# Patient Record
Sex: Male | Born: 1950 | Race: White | Hispanic: No | Marital: Married | Smoking: Former smoker
Health system: Southern US, Community
[De-identification: ages and names within clinical notes are randomized; demographics above are authoritative.]

## PROBLEM LIST (undated history)

## (undated) DIAGNOSIS — Z973 Presence of spectacles and contact lenses: Secondary | ICD-10-CM

## (undated) DIAGNOSIS — K219 Gastro-esophageal reflux disease without esophagitis: Secondary | ICD-10-CM

## (undated) DIAGNOSIS — Z8709 Personal history of other diseases of the respiratory system: Secondary | ICD-10-CM

## (undated) DIAGNOSIS — I10 Essential (primary) hypertension: Secondary | ICD-10-CM

## (undated) DIAGNOSIS — E785 Hyperlipidemia, unspecified: Secondary | ICD-10-CM

## (undated) HISTORY — PX: TONSILLECTOMY: SUR1361

## (undated) HISTORY — PX: COLONOSCOPY: SHX174

---

## 2005-01-27 LAB — HM COLONOSCOPY: HM Colonoscopy: NORMAL

## 2011-08-16 ENCOUNTER — Observation Stay (HOSPITAL_COMMUNITY)
Admission: EM | Admit: 2011-08-16 | Discharge: 2011-08-17 | Disposition: A | Discharge status: Home / Self Care | Payer: BC Managed Care – PPO | Attending: Emergency Medicine | Admitting: Emergency Medicine

## 2011-08-16 ENCOUNTER — Other Ambulatory Visit: Payer: Self-pay

## 2011-08-16 ENCOUNTER — Emergency Department (HOSPITAL_COMMUNITY): Payer: BC Managed Care – PPO

## 2011-08-16 ENCOUNTER — Encounter (HOSPITAL_COMMUNITY): Payer: Self-pay | Admitting: Emergency Medicine

## 2011-08-16 DIAGNOSIS — K219 Gastro-esophageal reflux disease without esophagitis: Secondary | ICD-10-CM | POA: Insufficient documentation

## 2011-08-16 DIAGNOSIS — F411 Generalized anxiety disorder: Secondary | ICD-10-CM | POA: Insufficient documentation

## 2011-08-16 DIAGNOSIS — R079 Chest pain, unspecified: Principal | ICD-10-CM | POA: Insufficient documentation

## 2011-08-16 DIAGNOSIS — I10 Essential (primary) hypertension: Secondary | ICD-10-CM | POA: Insufficient documentation

## 2011-08-16 DIAGNOSIS — E785 Hyperlipidemia, unspecified: Secondary | ICD-10-CM | POA: Insufficient documentation

## 2011-08-16 HISTORY — DX: Essential (primary) hypertension: I10

## 2011-08-16 HISTORY — DX: Gastro-esophageal reflux disease without esophagitis: K21.9

## 2011-08-16 HISTORY — DX: Hyperlipidemia, unspecified: E78.5

## 2011-08-16 LAB — POCT I-STAT, CHEM 8
BUN: 10 mg/dL (ref 6–23)
Calcium, Ion: 1.12 mmol/L (ref 1.12–1.32)
Chloride: 104 meq/L (ref 96–112)
Creatinine, Ser: 0.9 mg/dL (ref 0.50–1.35)
Glucose, Bld: 120 mg/dL — ABNORMAL HIGH (ref 70–99)
HCT: 40 % (ref 39.0–52.0)
Hemoglobin: 13.6 g/dL (ref 13.0–17.0)
Potassium: 3.6 meq/L (ref 3.5–5.1)
Sodium: 142 meq/L (ref 135–145)
TCO2: 27 mmol/L (ref 0–100)

## 2011-08-16 LAB — CBC
HCT: 39.2 % (ref 39.0–52.0)
Hemoglobin: 14 g/dL (ref 13.0–17.0)
MCH: 33.4 pg (ref 26.0–34.0)
MCHC: 35.7 g/dL (ref 30.0–36.0)
MCV: 93.6 fL (ref 78.0–100.0)
Platelets: 189 K/uL (ref 150–400)
RBC: 4.19 MIL/uL — ABNORMAL LOW (ref 4.22–5.81)
RDW: 13.9 % (ref 11.5–15.5)
WBC: 10.5 K/uL (ref 4.0–10.5)

## 2011-08-16 LAB — POCT I-STAT TROPONIN I: Troponin i, poc: 0.01 ng/mL (ref 0.00–0.08)

## 2011-08-16 MED ORDER — SODIUM CHLORIDE 0.9 % IV SOLN: 1000.0000 mL | INTRAVENOUS | Status: DC

## 2011-08-16 MED ORDER — SODIUM CHLORIDE 0.9 % IV SOLN
20.0000 mL | INTRAVENOUS | Status: DC
  Administered 2011-08-16: 20 mL via INTRAVENOUS

## 2011-08-16 MED ORDER — ACETAMINOPHEN 325 MG PO TABS
650.0000 mg | ORAL_TABLET | Freq: Once | ORAL | Status: AC
  Administered 2011-08-16: 650 mg via ORAL
  Filled 2011-08-16: qty 2

## 2011-08-16 MED ORDER — ZOLPIDEM TARTRATE 5 MG PO TABS
5.0000 mg | ORAL_TABLET | Freq: Every evening | ORAL | Status: DC | PRN
  Administered 2011-08-17: 5 mg via ORAL
  Filled 2011-08-16: qty 1

## 2011-08-16 NOTE — ED Provider Notes (Signed)
History     CSN: [id number]  Arrival date & time 08/16/11  1814   First MD Initiated Contact with Patient 08/16/11 1842      Chief Complaint  Patient presents with  . Chest Pain    (Consider location/radiation/quality/duration/timing/severity/associated sxs/prior treatment) HPI Comments: Patient has had a very stressful and emotional week,  Reporting his mother died and they had her funeral yesterday.  Patient is a 61 y.o. male presenting with chest pain. The history is provided by the patient and the spouse.  Chest Pain The chest pain began 12 - 24 hours ago. Chest pain occurs constantly. The chest pain is unchanged. The pain is associated with stress. At its most intense, the pain is at 1/10 (He denies pain,  describes pressure sensation.). The pain is currently at 1/10. The quality of the pain is described as pressure-like. The pain does not radiate. Exacerbated by: Nothing worsens pain. Pertinent negatives for primary symptoms include no fever, no fatigue, no shortness of breath, no cough, no wheezing, no palpitations, no abdominal pain, no nausea and no dizziness.  Pertinent negatives for associated symptoms include no lower extremity edema, no numbness, no orthopnea and no weakness. Treatments tried: He was given aspirin prior to arrival per EMS.  He was also given 3 doses of nitroglycerin spray, the first dose relieved his pressure somewhat, remaining doses simply caused headache. Risk factors include being elderly and male gender.  His past medical history is significant for anxiety/panic attacks, hyperlipidemia and hypertension. Procedure history comments: He reports having a cardiac stress test 5 years ago at an outside hospital which he states was negative..     Past Medical History  Diagnosis Date  . Hypertension   . Hyperlipidemia   . GERD (gastroesophageal reflux disease)     History reviewed. No pertinent past surgical history.  History reviewed. No pertinent family  history.  History  Substance Use Topics  . Smoking status: Former Games developer  . Smokeless tobacco: Not on file  . Alcohol Use: Yes     socially      Review of Systems  Constitutional: Negative for fever and fatigue.  HENT: Negative for congestion, sore throat and neck pain.   Eyes: Negative.   Respiratory: Positive for chest tightness. Negative for cough, shortness of breath and wheezing.   Cardiovascular: Negative for chest pain, palpitations and orthopnea.  Gastrointestinal: Negative for nausea and abdominal pain.  Genitourinary: Negative.   Musculoskeletal: Negative for joint swelling and arthralgias.  Skin: Negative.  Negative for rash and wound.  Neurological: Negative for dizziness, weakness, light-headedness, numbness and headaches.  Hematological: Negative.   Psychiatric/Behavioral: Negative.     Allergies  Erythromycin  Home Medications   Current Outpatient Rx  Name Route Sig Dispense Refill  . ALBUTEROL SULFATE HFA 108 (90 BASE) MCG/ACT IN AERS Inhalation Inhale 2 puffs into the lungs every 6 (six) hours as needed. For allergies.    . ASPIRIN EC 81 MG PO TBEC Oral Take 81 mg by mouth at bedtime.    . ATORVASTATIN CALCIUM 10 MG PO TABS Oral Take 10 mg by mouth at bedtime.    Marland Kitchen ESOMEPRAZOLE MAGNESIUM 40 MG PO CPDR Oral Take 40 mg by mouth daily before breakfast.    . LISINOPRIL-HYDROCHLOROTHIAZIDE 20-12.5 MG PO TABS Oral Take 1 tablet by mouth daily.    Marland Kitchen LORATADINE 10 MG PO TABS Oral Take 10 mg by mouth daily.    . MOMETASONE FUROATE 50 MCG/ACT NA SUSP Nasal Place 2 sprays  into the nose as needed. Use before flying.    Marland Kitchen SIMETHICONE 80 MG PO CHEW Oral Chew 80 mg by mouth every 6 (six) hours as needed. For gas.    Marland Kitchen ZOLPIDEM TARTRATE 10 MG PO TABS Oral Take 10 mg by mouth at bedtime as needed. For sleep.      BP 139/85  Pulse 66  Temp(Src) 98.5 F (36.9 C) (Oral)  Resp 19  Ht 5\' 11"  (1.803 m)  Wt 203 lb (92.08 kg)  BMI 28.31 kg/m2  SpO2 100%  Physical Exam    Nursing note and vitals reviewed. Constitutional: He is oriented to person, place, and time. He appears well-developed and well-nourished.  HENT:  Head: Normocephalic and atraumatic.  Eyes: Conjunctivae are normal.  Neck: Normal range of motion.  Cardiovascular: Normal rate, regular rhythm, normal heart sounds and intact distal pulses.  Exam reveals no friction rub.   No murmur heard. Pulmonary/Chest: Effort normal and breath sounds normal. He has no wheezes.  Abdominal: Soft. Bowel sounds are normal. There is no tenderness.  Musculoskeletal: Normal range of motion.  Neurological: He is alert and oriented to person, place, and time.  Skin: Skin is warm and dry.  Psychiatric: He has a normal mood and affect.    ED Course  Procedures (including critical care time)  Labs Reviewed  CBC - Abnormal; Notable for the following:    RBC 4.19 (*)    All other components within normal limits  POCT I-STAT, CHEM 8 - Abnormal; Notable for the following:    Glucose, Bld 120 (*)    All other components within normal limits  POCT I-STAT TROPONIN I   Dg Chest Portable 1 View  08/16/2011  *RADIOLOGY REPORT*  Clinical Data: Chest pain.  History of hypertension, pneumonia.  PORTABLE CHEST - 1 VIEW  Comparison: None.  Findings: Heart size is normal.  The lungs are free of focal consolidations and pleural effusions.  No edema.  Degenerative changes are seen in the spine.  IMPRESSION: No evidence for acute cardiopulmonary abnormality.  Original Report Authenticated By: Patterson Hammersmith, M.D.     No diagnosis found.    MDM  Patient placed in cdu for chest pain  cdu observation protocol.  Discussed case with Levy Sjogren,  NP.  Patient was seen by Dr Ignacia Palma prior to being transferred to cdu.       Candis Musa, PA 08/16/11 2109

## 2011-08-16 NOTE — ED Notes (Signed)
Per ems-  Pt coming from [place] where he was seeing his primarcy care doctor for chest pain that started this morning around 0645.  Pt was diagnosed with LBBB.  Pt received 324 asa and three Nitro sl sprays.  Pt states tightness in chest 1/10 at this time.

## 2011-08-16 NOTE — ED Provider Notes (Signed)
9:10 Report received from [facility]. Patient will be coming to CDU tonight on chest pain protocol.  Patient has had a pressure midsternal 1/10 all day. He is for CT of the heart with a BMI around 25. He has a left bundle branch block but this this is not new. Coming from his PCP today. 11:30pm  Report given to Dr. Hyman Hopes.  Pt remains 1/10 presently with chest pressure.     Jethro Bastos, NP 08/17/11 [phone]

## 2011-08-16 NOTE — ED Provider Notes (Signed)
8:27 PM  Date: 08/16/2011  Rate: 64  Rhythm: normal sinus rhythm  QRS Axis: left  Intervals: normal  ST/T Wave abnormalities: ST depressions anteriorly and ST depressions laterally  Conduction Disutrbances:left bundle branch block  Narrative Interpretation: Abnormal EKG.   Old EKG Reviewed: none available    Carleene Cooper III, MD 08/18/11 1610  Carleene Cooper III, MD 08/18/11 [phone]

## 2011-08-17 ENCOUNTER — Other Ambulatory Visit: Payer: Self-pay

## 2011-08-17 ENCOUNTER — Observation Stay (HOSPITAL_COMMUNITY): Payer: BC Managed Care – PPO

## 2011-08-17 LAB — POCT I-STAT TROPONIN I

## 2011-08-17 MED ORDER — METOPROLOL TARTRATE 1 MG/ML IV SOLN
5.0000 mg | Freq: Once | INTRAVENOUS | Status: AC
  Administered 2011-08-17: 5 mg via INTRAVENOUS

## 2011-08-17 MED ORDER — METOPROLOL TARTRATE 25 MG PO TABS
ORAL_TABLET | ORAL | Status: AC
  Filled 2011-08-17: qty 2

## 2011-08-17 MED ORDER — IOHEXOL 350 MG/ML SOLN
80.0000 mL | Freq: Once | INTRAVENOUS | Status: AC | PRN
  Administered 2011-08-17: 80 mL via INTRAVENOUS

## 2011-08-17 MED ORDER — NITROGLYCERIN 0.4 MG SL SUBL
SUBLINGUAL_TABLET | SUBLINGUAL | Status: AC
  Filled 2011-08-17: qty 25

## 2011-08-17 MED ORDER — NITROGLYCERIN 0.4 MG SL SUBL
0.4000 mg | SUBLINGUAL_TABLET | Freq: Once | SUBLINGUAL | Status: AC
  Administered 2011-08-17: 0.4 mg via SUBLINGUAL

## 2011-08-17 MED ORDER — METOPROLOL TARTRATE 1 MG/ML IV SOLN
INTRAVENOUS | Status: AC
  Filled 2011-08-17: qty 10

## 2011-08-17 MED ORDER — METOPROLOL TARTRATE 25 MG PO TABS
50.0000 mg | ORAL_TABLET | Freq: Once | ORAL | Status: AC
  Administered 2011-08-17: 50 mg via ORAL

## 2011-08-17 MED ORDER — PANTOPRAZOLE SODIUM 40 MG PO TBEC
40.0000 mg | DELAYED_RELEASE_TABLET | Freq: Every day | ORAL | Status: DC
  Administered 2011-08-17: 40 mg via ORAL
  Filled 2011-08-17: qty 1

## 2011-08-17 NOTE — ED Notes (Signed)
PT BMI IS 28.3 

## 2011-08-17 NOTE — ED Notes (Signed)
Pt requesting something to help him sleep, pt given Ambien 5mg  for sleep.  Pt denies any chest pain, skin warm and dry color appropriate.  Cardiac Monitor NSR.

## 2011-08-17 NOTE — ED Provider Notes (Signed)
CTA with minimal plaque considered normal for age per Dr. Azell Der. Patient asymptomatic and will follow up with primary care.  Rodena Medin, PA-C 08/17/11 1052

## 2011-08-17 NOTE — ED Provider Notes (Signed)
BP 140/94  Pulse 73  Temp(Src) 97.9 F (36.6 C) (Oral)  Resp 18  Ht 5\' 11"  (1.803 m)  Wt 203 lb (92.08 kg)  BMI 28.31 kg/m2  SpO2 100%   Date: 08/17/2011  Rate: 65  Rhythm: normal sinus rhythm  QRS Axis: left  Intervals: normal  ST/T Wave abnormalities: nonspecific ST changes  Conduction Disutrbances:left bundle branch block  Narrative Interpretation:   Old EKG Reviewed: unchanged    Forbes Cellar, MD 08/17/11 [phone]

## 2011-08-17 NOTE — Discharge Instructions (Signed)
FOLLOW UP WITH A PRIMARY CARE DOCTOR OF YOUR CHOICE. RETURN HERE WITH ANY NEW OR CONCERNING SYMPTOMS.  Chest Pain (Nonspecific) It is often hard to give a specific diagnosis for the cause of chest pain. There is always a chance that your pain could be related to something serious, such as a heart attack or a blood clot in the lungs. You need to follow up with your caregiver for further evaluation. CAUSES   Heartburn.   Pneumonia or bronchitis.   Anxiety or stress.   Inflammation around your heart (pericarditis) or lung (pleuritis or pleurisy).   A blood clot in the lung.   A collapsed lung (pneumothorax). It can develop suddenly on its own (spontaneous pneumothorax) or from injury (trauma) to the chest.   Shingles infection (herpes zoster virus).  The chest wall is composed of bones, muscles, and cartilage. Any of these can be the source of the pain.  The bones can be bruised by injury.   The muscles or cartilage can be strained by coughing or overwork.   The cartilage can be affected by inflammation and become sore (costochondritis).  DIAGNOSIS  Lab tests or other studies, such as X-rays, electrocardiography, stress testing, or cardiac imaging, may be needed to find the cause of your pain.  TREATMENT   Treatment depends on what may be causing your chest pain. Treatment may include:   Acid blockers for heartburn.   Anti-inflammatory medicine.   Pain medicine for inflammatory conditions.   Antibiotics if an infection is present.   You may be advised to change lifestyle habits. This includes stopping smoking and avoiding alcohol, caffeine, and chocolate.   You may be advised to keep your head raised (elevated) when sleeping. This reduces the chance of acid going backward from your stomach into your esophagus.   Most of the time, nonspecific chest pain will improve within 2 to 3 days with rest and mild pain medicine.  HOME CARE INSTRUCTIONS   If antibiotics were  prescribed, take your antibiotics as directed. Finish them even if you start to feel better.   For the next few days, avoid physical activities that bring on chest pain. Continue physical activities as directed.   Do not smoke.   Avoid drinking alcohol.   Only take over-the-counter or prescription medicine for pain, discomfort, or fever as directed by your caregiver.   Follow your caregiver's suggestions for further testing if your chest pain does not go away.   Keep any follow-up appointments you made. If you do not go to an appointment, you could develop lasting (chronic) problems with pain. If there is any problem keeping an appointment, you must call to reschedule.  SEEK MEDICAL CARE IF:   You think you are having problems from the medicine you are taking. Read your medicine instructions carefully.   Your chest pain does not go away, even after treatment.   You develop a rash with blisters on your chest.  SEEK IMMEDIATE MEDICAL CARE IF:   You have increased chest pain or pain that spreads to your arm, neck, jaw, back, or abdomen.   You develop shortness of breath, an increasing cough, or you are coughing up blood.   You have severe back or abdominal pain, feel nauseous, or vomit.   You develop severe weakness, fainting, or chills.   You have a fever.  THIS IS AN EMERGENCY. Do not wait to see if the pain will go away. Get medical help at once. Call your local emergency services (  911 in [place] Do not drive yourself to the hospital. MAKE SURE YOU:   Understand these instructions.   Will watch your condition.   Will get help right away if you are not doing well or get worse.  Document Released: 02/17/2005 Document Revised: 04/29/2011 Document Reviewed: 12/14/2007 [facility] Patient Information 2012 [place], [place].

## 2011-08-17 NOTE — ED Notes (Signed)
Pa has been in to discuss results with pt 

## 2011-08-17 NOTE — Progress Notes (Signed)
Observation review is complete. 

## 2011-08-17 NOTE — ED Notes (Signed)
Pt back in room from ct. Tolerated well

## 2011-08-18 NOTE — ED Provider Notes (Signed)
Medical screening examination/treatment/procedure(s) were conducted as a shared visit with non-physician practitioner(s) and myself.  I personally evaluated the patient during the encounter 61 year old man with chest pressure, referred in from a doctor in [place] because of chest pain and EKG showing left bundle branch block.  He was asymptomatic when seen by me, with EKG showing a left bundle branch block, Cardiac markers negative, and exam of heart and lungs benign.  Advised CDU admission under chest pain protocol.   Carleene Cooper III, MD 08/18/11 [phone]

## 2011-08-18 NOTE — ED Provider Notes (Signed)
Medical screening examination/treatment/procedure(s) were performed by non-physician practitioner and as supervising physician I was immediately available for consultation/collaboration.   Carleene Cooper III, MD 08/18/11 [phone]

## 2011-09-24 ENCOUNTER — Ambulatory Visit: Payer: BC Managed Care – PPO | Admitting: Internal Medicine

## 2011-10-27 ENCOUNTER — Ambulatory Visit: Payer: BC Managed Care – PPO | Admitting: Internal Medicine

## 2011-11-05 ENCOUNTER — Ambulatory Visit (INDEPENDENT_AMBULATORY_CARE_PROVIDER_SITE_OTHER): Payer: BC Managed Care – PPO | Admitting: Internal Medicine

## 2011-11-05 ENCOUNTER — Ambulatory Visit (INDEPENDENT_AMBULATORY_CARE_PROVIDER_SITE_OTHER)
Admission: RE | Admit: 2011-11-05 | Discharge: 2011-11-05 | Disposition: A | Discharge status: Home / Self Care | Payer: BC Managed Care – PPO | Source: Ambulatory Visit | Attending: Internal Medicine | Admitting: Internal Medicine

## 2011-11-05 ENCOUNTER — Encounter: Payer: Self-pay | Admitting: Internal Medicine

## 2011-11-05 VITALS — BP 128/82 | HR 83 | Temp 98.6°F | Resp 16 | Ht 71.0 in | Wt 208.0 lb

## 2011-11-05 DIAGNOSIS — M25571 Pain in right ankle and joints of right foot: Secondary | ICD-10-CM

## 2011-11-05 DIAGNOSIS — Z23 Encounter for immunization: Secondary | ICD-10-CM

## 2011-11-05 DIAGNOSIS — M25579 Pain in unspecified ankle and joints of unspecified foot: Secondary | ICD-10-CM

## 2011-11-05 MED ORDER — CELECOXIB 200 MG PO CAPS: 200.0000 mg | ORAL_CAPSULE | Freq: Every day | ORAL | Status: DC

## 2011-11-05 NOTE — Patient Instructions (Signed)
Degenerative Arthritis  You have osteoarthritis. This is the wear and tear arthritis that comes with aging. It is also called degenerative arthritis. This is common in people past middle age. It is caused by stress on the joints. The large weight bearing joints of the lower extremities are most often affected. The knees, hips, back, neck, and hands can become painful, swollen, and stiff. This is the most common type of arthritis. It comes on with age, carrying too much weight, or from an injury.  Treatment includes resting the sore joint until the pain and swelling improve. Crutches or a walker may be needed for severe flares. Only take over-the-counter or prescription medicines for pain, discomfort, or fever as directed by your caregiver. Local heat therapy may improve motion. Cortisone shots into the joint are sometimes used to reduce pain and swelling during flares.  Osteoarthritis is usually not crippling and progresses slowly. There are things you can do to decrease pain:  · Avoid high impact activities.  · Exercise regularly.  · Low impact exercises such as walking, biking and swimming help to keep the muscles strong and keep normal joint function.  · Stretching helps to keep your range of motion.  · Lose weight if you are overweight. This reduces joint stress.  In severe cases when you have pain at rest or increasing disability, joint surgery may be helpful. See your caregiver for follow-up treatment as recommended.   SEEK IMMEDIATE MEDICAL CARE IF:   · You have severe joint pain.  · Marked swelling and redness in your joint develops.  · You develop a high fever.  Document Released: 05/10/2005 Document Revised: 04/29/2011 Document Reviewed: 10/10/2006  [employer]® Patient Information ©2012 [employer].

## 2011-11-05 NOTE — Progress Notes (Signed)
  Subjective:    Patient ID: Kevin Aguirre, male    DOB: [birth year], 61 y.o.   MRN: [id number]  Arthritis Presents for initial visit. The disease course has been stable. Onset time: 5. He complains of pain and stiffness. He reports no joint swelling or joint warmth. Affected locations include the right ankle. His pain is at a severity of 2/10. Pertinent negatives include no pain at night, pain while resting, rash or weight loss. His past medical history is significant for osteoarthritis (right knee). There is no history of chronic back pain, lupus, psoriasis or rheumatoid arthritis.  His pertinent risk factors include overuse. His family medical history includes family history of osteoarthritis. Past treatments include nothing. Factors aggravating his arthritis include activity.      Review of Systems  Constitutional: Negative.  Negative for weight loss.  HENT: Negative.   Eyes: Negative.   Respiratory: Negative.   Cardiovascular: Negative.   Gastrointestinal: Negative.   Genitourinary: Negative.   Musculoskeletal: Positive for arthralgias (right ankle), arthritis and stiffness. Negative for myalgias, back pain, joint swelling and gait problem.  Skin: Negative.  Negative for rash.  Neurological: Negative.   Hematological: Negative.   Psychiatric/Behavioral: Negative.        Objective:   Physical Exam  Vitals reviewed. Constitutional: He is oriented to person, place, and time. He appears well-developed and well-nourished. No distress.  HENT:  Head: Normocephalic and atraumatic.  Mouth/Throat: No oropharyngeal exudate.  Eyes: Conjunctivae are normal. Right eye exhibits no discharge. Left eye exhibits no discharge. No scleral icterus.  Neck: Normal range of motion. Neck supple. No JVD present. No tracheal deviation present. No thyromegaly present.  Cardiovascular: Normal rate, regular rhythm, normal heart sounds and intact distal pulses.  Exam reveals no gallop and no friction rub.    No murmur heard. Pulmonary/Chest: Effort normal and breath sounds normal. No respiratory distress. He has no wheezes. He has no rales. He exhibits no tenderness.  Abdominal: Soft. Bowel sounds are normal. He exhibits no distension and no mass. There is no tenderness. There is no rebound and no guarding.  Musculoskeletal: Normal range of motion. He exhibits no edema and no tenderness.       Right knee: Normal. He exhibits normal range of motion, no swelling, no effusion, no ecchymosis, no deformity, no laceration, no erythema, normal alignment, no LCL laxity, normal patellar mobility, no bony tenderness, normal meniscus and no MCL laxity. no tenderness found.       Right ankle: Normal. He exhibits normal range of motion, no swelling, no ecchymosis, no deformity, no laceration and normal pulse. no tenderness. No lateral malleolus and no medial malleolus tenderness found. Achilles tendon normal. Achilles tendon exhibits no pain, no defect and normal Thompson's test results.  Lymphadenopathy:    He has no cervical adenopathy.  Neurological: He is oriented to person, place, and time.  Skin: Skin is warm and dry. No rash noted. He is not diaphoretic. No erythema. No pallor.  Psychiatric: He has a normal mood and affect. His behavior is normal. Judgment normal.          Assessment & Plan:

## 2011-11-07 ENCOUNTER — Encounter: Payer: Self-pay | Admitting: Internal Medicine

## 2011-11-07 NOTE — Assessment & Plan Note (Signed)
Exam and xray are normal, he will try celebrex and will consider seeing ortho, he has a torn meniscus in his right knee that may need repair as that may be affecting his right ankle

## 2011-11-30 ENCOUNTER — Encounter: Payer: Self-pay | Admitting: Internal Medicine

## 2011-11-30 ENCOUNTER — Ambulatory Visit (INDEPENDENT_AMBULATORY_CARE_PROVIDER_SITE_OTHER): Payer: BC Managed Care – PPO | Admitting: Internal Medicine

## 2011-11-30 ENCOUNTER — Ambulatory Visit (INDEPENDENT_AMBULATORY_CARE_PROVIDER_SITE_OTHER)
Admission: RE | Admit: 2011-11-30 | Discharge: 2011-11-30 | Disposition: A | Discharge status: Home / Self Care | Payer: BC Managed Care – PPO | Source: Ambulatory Visit | Attending: Internal Medicine | Admitting: Internal Medicine

## 2011-11-30 VITALS — BP 134/82 | HR 72 | Temp 97.5°F | Resp 16 | Wt 209.2 lb

## 2011-11-30 DIAGNOSIS — M545 Low back pain: Secondary | ICD-10-CM

## 2011-11-30 DIAGNOSIS — M25571 Pain in right ankle and joints of right foot: Secondary | ICD-10-CM

## 2011-11-30 DIAGNOSIS — M25579 Pain in unspecified ankle and joints of unspecified foot: Secondary | ICD-10-CM

## 2011-11-30 MED ORDER — CELECOXIB 200 MG PO CAPS: 200.0000 mg | ORAL_CAPSULE | Freq: Every day | ORAL | Status: DC

## 2011-11-30 MED ORDER — OXYCODONE-ACETAMINOPHEN 7.5-325 MG PO TABS: 1.0000 | ORAL_TABLET | ORAL | Status: AC | PRN

## 2011-11-30 MED ORDER — CELECOXIB 200 MG PO CAPS: 200.0000 mg | ORAL_CAPSULE | Freq: Every day | ORAL | Status: AC

## 2011-11-30 NOTE — Assessment & Plan Note (Signed)
He tells me that the pain resolved with celebrex

## 2011-11-30 NOTE — Progress Notes (Signed)
Subjective:    Patient ID: Kevin Aguirre, male    DOB: 01/20/1951, 61 y.o.   MRN: [id number]  Back Pain This is a recurrent problem. The current episode started 1 to 4 weeks ago. The problem occurs intermittently. The problem is unchanged. The pain is present in the lumbar spine. The quality of the pain is described as aching ("tightness"). The pain does not radiate. The pain is at a severity of 5/10. The pain is moderate. The pain is worse during the day. The symptoms are aggravated by twisting. Stiffness is present all day. Pertinent negatives include no abdominal pain, bladder incontinence, bowel incontinence, chest pain, dysuria, fever, headaches, leg pain, numbness, paresis, paresthesias, pelvic pain, perianal numbness, tingling, weakness or weight loss. Risk factors include recent trauma (heavy lifting). He has tried NSAIDs, ice and analgesics for the symptoms. The treatment provided mild relief.      Review of Systems  Constitutional: Negative.  Negative for fever and weight loss.  HENT: Negative.   Eyes: Negative.   Respiratory: Negative.   Cardiovascular: Negative.  Negative for chest pain.  Gastrointestinal: Negative.  Negative for abdominal pain and bowel incontinence.  Genitourinary: Negative.  Negative for bladder incontinence, dysuria and pelvic pain.  Musculoskeletal: Positive for back pain. Negative for myalgias, joint swelling, arthralgias and gait problem.  Skin: Negative.   Neurological: Negative.  Negative for tingling, tremors, weakness, numbness, headaches and paresthesias.  Hematological: Negative.   Psychiatric/Behavioral: Negative.        Objective:   Physical Exam  Vitals reviewed. Constitutional: He is oriented to person, place, and time. He appears well-developed and well-nourished. No distress.  HENT:  Head: Normocephalic and atraumatic.  Mouth/Throat: Oropharynx is clear and moist. No oropharyngeal exudate.  Eyes: Conjunctivae are normal. Right eye  exhibits no discharge. Left eye exhibits no discharge. No scleral icterus.  Neck: Normal range of motion. Neck supple. No JVD present. No tracheal deviation present. No thyromegaly present.  Cardiovascular: Normal rate, regular rhythm, normal heart sounds and intact distal pulses.  Exam reveals no gallop and no friction rub.   No murmur heard. Pulmonary/Chest: Effort normal and breath sounds normal. No stridor. No respiratory distress. He has no wheezes. He has no rales. He exhibits no tenderness.  Abdominal: Soft. Bowel sounds are normal. He exhibits no distension and no mass. There is no tenderness. There is no rebound and no guarding.  Musculoskeletal: Normal range of motion. He exhibits no edema and no tenderness.       Lumbar back: Normal. He exhibits normal range of motion, no tenderness, no bony tenderness, no swelling, no edema, no deformity, no laceration, no pain, no spasm and normal pulse.  Lymphadenopathy:    He has no cervical adenopathy.  Neurological: He is alert and oriented to person, place, and time. He has normal strength. He displays no atrophy, no tremor and normal reflexes. No cranial nerve deficit or sensory deficit. He exhibits normal muscle tone. He displays a negative Romberg sign. He displays no seizure activity. Coordination and gait normal. He displays no Babinski's sign on the right side. He displays no Babinski's sign on the left side.  Reflex Scores:      Tricep reflexes are 1+ on the right side and 1+ on the left side.      Bicep reflexes are 1+ on the left side.      Brachioradialis reflexes are 1+ on the right side.      Patellar reflexes are 1+ on the right side and  1+ on the left side.      Achilles reflexes are 1+ on the right side and 1+ on the left side.      - SLR in BLE  Skin: Skin is warm and dry. No rash noted. He is not diaphoretic. No erythema. No pallor.  Psychiatric: He has a normal mood and affect. His behavior is normal. Judgment and thought  content normal.          Assessment & Plan:

## 2011-11-30 NOTE — Patient Instructions (Signed)

## 2011-11-30 NOTE — Assessment & Plan Note (Signed)
He has no s/s of radiculopathy. I will check plain films to look for ddd, spurs, occult fracture, he will continue taking the current meds and I have given him percocet tot take for more severe pain

## 2011-12-02 ENCOUNTER — Other Ambulatory Visit: Payer: Self-pay | Admitting: Internal Medicine

## 2011-12-02 DIAGNOSIS — R937 Abnormal findings on diagnostic imaging of other parts of musculoskeletal system: Secondary | ICD-10-CM

## 2011-12-02 DIAGNOSIS — M545 Low back pain: Secondary | ICD-10-CM

## 2011-12-09 ENCOUNTER — Ambulatory Visit
Admission: RE | Admit: 2011-12-09 | Discharge: 2011-12-09 | Disposition: A | Discharge status: Home / Self Care | Payer: BC Managed Care – PPO | Source: Ambulatory Visit | Attending: Internal Medicine | Admitting: Internal Medicine

## 2011-12-09 DIAGNOSIS — R937 Abnormal findings on diagnostic imaging of other parts of musculoskeletal system: Secondary | ICD-10-CM

## 2011-12-09 DIAGNOSIS — M545 Low back pain: Secondary | ICD-10-CM

## 2011-12-10 ENCOUNTER — Other Ambulatory Visit: Payer: BC Managed Care – PPO

## 2011-12-10 ENCOUNTER — Encounter: Payer: Self-pay | Admitting: Internal Medicine

## 2012-04-05 ENCOUNTER — Telehealth: Payer: Self-pay | Admitting: Internal Medicine

## 2012-04-05 MED ORDER — LISINOPRIL-HYDROCHLOROTHIAZIDE 20-12.5 MG PO TABS: 1.0000 | ORAL_TABLET | Freq: Every day | ORAL | Status: DC

## 2012-04-05 NOTE — Telephone Encounter (Signed)
Patient needs a refill on Lisinopril sent to [facility], and also a 30 day supply sent to [place] [facility] to last until he gets the medication from [facility]

## 2012-04-10 ENCOUNTER — Other Ambulatory Visit: Payer: Self-pay | Admitting: Internal Medicine

## 2012-04-10 MED ORDER — MOMETASONE FUROATE 50 MCG/ACT NA SUSP: 2.0000 | NASAL | Status: DC | PRN

## 2012-05-15 ENCOUNTER — Other Ambulatory Visit (INDEPENDENT_AMBULATORY_CARE_PROVIDER_SITE_OTHER): Payer: BC Managed Care – PPO

## 2012-05-15 ENCOUNTER — Ambulatory Visit (INDEPENDENT_AMBULATORY_CARE_PROVIDER_SITE_OTHER): Payer: BC Managed Care – PPO | Admitting: Internal Medicine

## 2012-05-15 ENCOUNTER — Encounter: Payer: Self-pay | Admitting: Internal Medicine

## 2012-05-15 VITALS — BP 112/68 | HR 73 | Temp 97.8°F | Resp 16 | Wt 207.5 lb

## 2012-05-15 DIAGNOSIS — R7309 Other abnormal glucose: Secondary | ICD-10-CM

## 2012-05-15 DIAGNOSIS — Z0000 Encounter for general adult medical examination without abnormal findings: Secondary | ICD-10-CM

## 2012-05-15 DIAGNOSIS — I10 Essential (primary) hypertension: Secondary | ICD-10-CM

## 2012-05-15 DIAGNOSIS — M545 Low back pain: Secondary | ICD-10-CM

## 2012-05-15 DIAGNOSIS — I2510 Atherosclerotic heart disease of native coronary artery without angina pectoris: Secondary | ICD-10-CM | POA: Insufficient documentation

## 2012-05-15 DIAGNOSIS — R9431 Abnormal electrocardiogram [ECG] [EKG]: Secondary | ICD-10-CM

## 2012-05-15 LAB — CBC WITH DIFFERENTIAL/PLATELET
Basophils Absolute: 0 10*3/uL (ref 0.0–0.1)
Hemoglobin: 15.6 g/dL (ref 13.0–17.0)
Lymphocytes Relative: 29.9 % (ref 12.0–46.0)
Monocytes Relative: 8.5 % (ref 3.0–12.0)
Neutro Abs: 4.5 10*3/uL (ref 1.4–7.7)
Neutrophils Relative %: 58.9 % (ref 43.0–77.0)
RBC: 4.71 Mil/uL (ref 4.22–5.81)
RDW: 13.5 % (ref 11.5–14.6)

## 2012-05-15 LAB — URINALYSIS, ROUTINE W REFLEX MICROSCOPIC
Specific Gravity, Urine: 1.015 (ref 1.000–1.030)
Total Protein, Urine: NEGATIVE
Urine Glucose: NEGATIVE

## 2012-05-15 LAB — PSA: PSA: 1.1 ng/mL (ref 0.10–4.00)

## 2012-05-15 LAB — LIPID PANEL
Cholesterol: 159 mg/dL (ref 0–200)
Triglycerides: 140 mg/dL (ref 0.0–149.0)

## 2012-05-15 LAB — COMPREHENSIVE METABOLIC PANEL
ALT: 49 U/L (ref 0–53)
BUN: 12 mg/dL (ref 6–23)
CO2: 29 mEq/L (ref 19–32)
Calcium: 9.3 mg/dL (ref 8.4–10.5)
Chloride: 102 mEq/L (ref 96–112)
Creatinine, Ser: 1.2 mg/dL (ref 0.4–1.5)
GFR: 68.21 mL/min (ref >60.00)

## 2012-05-15 NOTE — Assessment & Plan Note (Signed)
Exam done Vaccines were reviewed Labs were ordered Pt ed material was given

## 2012-05-15 NOTE — Assessment & Plan Note (Signed)
He has no s/s of this He will continue with risk modifications

## 2012-05-15 NOTE — Progress Notes (Signed)
Subjective:    Patient ID: Kevin Aguirre, male    DOB: 12-Feb-1951, 61 y.o.   MRN: [id number]  Hypertension This is a chronic problem. The current episode started more than 1 year ago. The problem is unchanged. The problem is controlled. Pertinent negatives include no anxiety, blurred vision, chest pain, headaches, malaise/fatigue, neck pain, orthopnea, palpitations, peripheral edema, PND, shortness of breath or sweats. Past treatments include ACE inhibitors and diuretics. The current treatment provides moderate improvement. Compliance problems include exercise and diet.  Hypertensive end-organ damage includes CAD/MI.      Review of Systems  Constitutional: Negative for fever, chills, malaise/fatigue, diaphoresis, activity change, appetite change, fatigue and unexpected weight change.  HENT: Negative.  Negative for neck pain.   Eyes: Negative.  Negative for blurred vision.  Respiratory: Negative for apnea, cough, choking, chest tightness, shortness of breath, wheezing and stridor.   Cardiovascular: Negative for chest pain, palpitations, orthopnea, leg swelling and PND.  Gastrointestinal: Negative for nausea, vomiting, abdominal pain, diarrhea, constipation and blood in stool.  Genitourinary: Negative.  Negative for urgency, frequency, decreased urine volume, difficulty urinating and testicular pain.  Musculoskeletal: Negative for myalgias, back pain, joint swelling, arthralgias and gait problem.  Skin: Negative for color change, pallor, rash and wound.  Neurological: Negative for dizziness, tremors, seizures, syncope, facial asymmetry, speech difficulty, weakness, light-headedness, numbness and headaches.  Hematological: Negative for adenopathy. Does not bruise/bleed easily.  Psychiatric/Behavioral: Negative.        Objective:   Physical Exam  Vitals reviewed. Constitutional: He is oriented to person, place, and time. He appears well-developed and well-nourished. No distress.  HENT:   Head: Normocephalic and atraumatic.  Mouth/Throat: Oropharynx is clear and moist. No oropharyngeal exudate.  Eyes: Conjunctivae normal are normal. Right eye exhibits no discharge. Left eye exhibits no discharge. No scleral icterus.  Neck: Normal range of motion. Neck supple. No JVD present. No tracheal deviation present. No thyromegaly present.  Cardiovascular: Normal rate, regular rhythm, normal heart sounds and intact distal pulses.  Exam reveals no gallop and no friction rub.   No murmur heard. Pulmonary/Chest: Effort normal and breath sounds normal. No stridor. No respiratory distress. He has no wheezes. He has no rales. He exhibits no tenderness.  Abdominal: Soft. Bowel sounds are normal. He exhibits no distension and no mass. There is no tenderness. There is no rebound and no guarding. Hernia confirmed negative in the right inguinal area and confirmed negative in the left inguinal area.  Genitourinary: Rectum normal, testes normal and penis normal. Rectal exam shows no external hemorrhoid, no internal hemorrhoid, no fissure, no mass, no tenderness and anal tone normal. Guaiac negative stool. Prostate is not enlarged and not tender. Right testis shows no mass, no swelling and no tenderness. Right testis is descended. Left testis shows no mass, no swelling and no tenderness. Left testis is descended. Circumcised. No penile erythema or penile tenderness. No discharge found.  Musculoskeletal: Normal range of motion. He exhibits no edema and no tenderness.  Lymphadenopathy:    He has no cervical adenopathy.       Right: No inguinal adenopathy present.       Left: No inguinal adenopathy present.  Neurological: He is oriented to person, place, and time.  Skin: Skin is warm and dry. No rash noted. He is not diaphoretic. No erythema. No pallor.  Psychiatric: He has a normal mood and affect. His behavior is normal. Judgment and thought content normal.     Lab Results  Component Value Date  WBC  10.5 08/16/2011   HGB 13.6 08/16/2011   HCT 40.0 08/16/2011   PLT 189 08/16/2011   GLUCOSE 120* 08/16/2011   NA 142 08/16/2011   K 3.6 08/16/2011   CL 104 08/16/2011   CREATININE 0.90 08/16/2011   BUN 10 08/16/2011        Assessment & Plan:

## 2012-05-15 NOTE — Patient Instructions (Signed)
Hypertension As your heart beats, it forces blood through your arteries. This force is your blood pressure. If the pressure is too high, it is called hypertension (HTN) or high blood pressure. HTN is dangerous because you may have it and not know it. High blood pressure may mean that your heart has to work harder to pump blood. Your arteries may be narrow or stiff. The extra work puts you at risk for heart disease, stroke, and other problems.  Blood pressure consists of two numbers, a higher number over a lower, 110/72, for example. It is stated as "110 over 72." The ideal is below 120 for the top number (systolic) and under 80 for the bottom (diastolic). Write down your blood pressure today. You should pay close attention to your blood pressure if you have certain conditions such as:  Heart failure.  Prior heart attack.  Diabetes  Chronic kidney disease.  Prior stroke.  Multiple risk factors for heart disease. To see if you have HTN, your blood pressure should be measured while you are seated with your arm held at the level of the heart. It should be measured at least twice. A one-time elevated blood pressure reading (especially in the Emergency Department) does not mean that you need treatment. There may be conditions in which the blood pressure is different between your right and left arms. It is important to see your caregiver soon for a recheck. Most people have essential hypertension which means that there is not a specific cause. This type of high blood pressure may be lowered by changing lifestyle factors such as:  Stress.  Smoking.  Lack of exercise.  Excessive weight.  Drug/tobacco/alcohol use.  Eating less salt. Most people do not have symptoms from high blood pressure until it has caused damage to the body. Effective treatment can often prevent, delay or reduce that damage. TREATMENT  When a cause has been identified, treatment for high blood pressure is directed at the  cause. There are a large number of medications to treat HTN. These fall into several categories, and your caregiver will help you select the medicines that are best for you. Medications may have side effects. You should review side effects with your caregiver. If your blood pressure stays high after you have made lifestyle changes or started on medicines,   Your medication(s) may need to be changed.  Other problems may need to be addressed.  Be certain you understand your prescriptions, and know how and when to take your medicine.  Be sure to follow up with your caregiver within the time frame advised (usually within two weeks) to have your blood pressure rechecked and to review your medications.  If you are taking more than one medicine to lower your blood pressure, make sure you know how and at what times they should be taken. Taking two medicines at the same time can result in blood pressure that is too low. SEEK IMMEDIATE MEDICAL CARE IF:  You develop a severe headache, blurred or changing vision, or confusion.  You have unusual weakness or numbness, or a faint feeling.  You have severe chest or abdominal pain, vomiting, or breathing problems. MAKE SURE YOU:   Understand these instructions.  Will watch your condition.  Will get help right away if you are not doing well or get worse. Document Released: 05/10/2005 Document Revised: 08/02/2011 Document Reviewed: 12/29/2007 Exit[employer] Patient Information 2013 [employer]. Health Maintenance, Males A healthy lifestyle and preventative care can promote health and wellness.  Maintain   regular health, dental, and eye exams.  Eat a healthy diet. Foods like vegetables, fruits, whole grains, low-fat dairy products, and lean protein foods contain the nutrients you need without too many calories. Decrease your intake of foods high in solid fats, added sugars, and salt. Get information about a proper diet from your caregiver, if  necessary.  Regular physical exercise is one of the most important things you can do for your health. Most adults should get at least 150 minutes of moderate-intensity exercise (any activity that increases your heart rate and causes you to sweat) each week. In addition, most adults need muscle-strengthening exercises on 2 or more days a week.   Maintain a healthy weight. The body mass index (BMI) is a screening tool to identify possible weight problems. It provides an estimate of body fat based on height and weight. Your caregiver can help determine your BMI, and can help you achieve or maintain a healthy weight. For adults 20 years and older:  A BMI below 18.5 is considered underweight.  A BMI of 18.5 to 24.9 is normal.  A BMI of 25 to 29.9 is considered overweight.  A BMI of 30 and above is considered obese.  Maintain normal blood lipids and cholesterol by exercising and minimizing your intake of saturated fat. Eat a balanced diet with plenty of fruits and vegetables. Blood tests for lipids and cholesterol should begin at age 20 and be repeated every 5 years. If your lipid or cholesterol levels are high, you are over 50, or you are a high risk for heart disease, you may need your cholesterol levels checked more frequently.Ongoing high lipid and cholesterol levels should be treated with medicines, if diet and exercise are not effective.  If you smoke, find out from your caregiver how to quit. If you do not use tobacco, do not start.  If you choose to drink alcohol, do not exceed 2 drinks per day. One drink is considered to be 12 ounces (355 mL) of beer, 5 ounces (148 mL) of wine, or 1.5 ounces (44 mL) of liquor.  Avoid use of street drugs. Do not share needles with anyone. Ask for help if you need support or instructions about stopping the use of drugs.  High blood pressure causes heart disease and increases the risk of stroke. Blood pressure should be checked at least every 1 to 2 years.  Ongoing high blood pressure should be treated with medicines if weight loss and exercise are not effective.  If you are 45 to 63 years old, ask your caregiver if you should take aspirin to prevent heart disease.  Diabetes screening involves taking a blood sample to check your fasting blood sugar level. This should be done once every 3 years, after age 45, if you are within normal weight and without risk factors for diabetes. Testing should be considered at a younger age or be carried out more frequently if you are overweight and have at least 1 risk factor for diabetes.  Colorectal cancer can be detected and often prevented. Most routine colorectal cancer screening begins at the age of 50 and continues through age 75. However, your caregiver may recommend screening at an earlier age if you have risk factors for colon cancer. On a yearly basis, your caregiver may provide home test kits to check for hidden blood in the stool. Use of a small camera at the end of a tube, to directly examine the colon (sigmoidoscopy or colonoscopy), can detect the earliest forms of colorectal   cancer. Talk to your caregiver about this at age 50, when routine screening begins. Direct examination of the colon should be repeated every 5 to 10 years through age 75, unless early forms of pre-cancerous polyps or small growths are found.  Hepatitis C blood testing is recommended for all people born from 1945 through 1965 and any individual with known risks for hepatitis C.  Healthy men should no longer receive prostate-specific antigen (PSA) blood tests as part of routine cancer screening. Consult with your caregiver about prostate cancer screening.  Testicular cancer screening is not recommended for adolescents or adult males who have no symptoms. Screening includes self-exam, caregiver exam, and other screening tests. Consult with your caregiver about any symptoms you have or any concerns you have about testicular  cancer.  Practice safe sex. Use condoms and avoid high-risk sexual practices to reduce the spread of sexually transmitted infections (STIs).  Use sunscreen with a sun protection factor (SPF) of 30 or greater. Apply sunscreen liberally and repeatedly throughout the day. You should seek shade when your shadow is shorter than you. Protect yourself by wearing long sleeves, pants, a wide-brimmed hat, and sunglasses year round, whenever you are outdoors.  Notify your caregiver of new moles or changes in moles, especially if there is a change in shape or color. Also notify your caregiver if a mole is larger than the size of a pencil eraser.  A one-time screening for abdominal aortic aneurysm (AAA) and surgical repair of large AAAs by sound wave imaging (ultrasonography) is recommended for ages 65 to 75 years who are current or former smokers.  Stay current with your immunizations. Document Released: 11/06/2007 Document Revised: 08/02/2011 Document Reviewed: 10/05/2010 [employer] Patient Information 2013 [employer].  

## 2012-05-15 NOTE — Assessment & Plan Note (Signed)
His BP is well controlled I will check his lytes and renal function 

## 2012-05-15 NOTE — Assessment & Plan Note (Signed)
I will check his A1C today to see if he has developed DM II 

## 2012-05-15 NOTE — Assessment & Plan Note (Signed)
His EKG is unchanged from prior EKG's

## 2012-06-02 ENCOUNTER — Other Ambulatory Visit: Payer: Self-pay

## 2012-06-05 ENCOUNTER — Other Ambulatory Visit: Payer: Self-pay

## 2012-06-05 MED ORDER — ESOMEPRAZOLE MAGNESIUM 40 MG PO CPDR: 40.0000 mg | DELAYED_RELEASE_CAPSULE | Freq: Every day | ORAL | Status: DC

## 2012-06-13 ENCOUNTER — Encounter: Payer: Self-pay | Admitting: Internal Medicine

## 2012-08-31 ENCOUNTER — Encounter: Payer: Self-pay | Admitting: Internal Medicine

## 2012-08-31 ENCOUNTER — Ambulatory Visit (INDEPENDENT_AMBULATORY_CARE_PROVIDER_SITE_OTHER): Payer: BC Managed Care – PPO | Admitting: Internal Medicine

## 2012-08-31 ENCOUNTER — Ambulatory Visit (INDEPENDENT_AMBULATORY_CARE_PROVIDER_SITE_OTHER)
Admission: RE | Admit: 2012-08-31 | Discharge: 2012-08-31 | Disposition: A | Discharge status: Home / Self Care | Payer: BC Managed Care – PPO | Source: Ambulatory Visit | Attending: Internal Medicine | Admitting: Internal Medicine

## 2012-08-31 VITALS — BP 110/78 | HR 92 | Temp 98.0°F | Resp 16 | Wt 205.0 lb

## 2012-08-31 DIAGNOSIS — J309 Allergic rhinitis, unspecified: Secondary | ICD-10-CM | POA: Insufficient documentation

## 2012-08-31 DIAGNOSIS — I2510 Atherosclerotic heart disease of native coronary artery without angina pectoris: Secondary | ICD-10-CM

## 2012-08-31 DIAGNOSIS — I10 Essential (primary) hypertension: Secondary | ICD-10-CM

## 2012-08-31 DIAGNOSIS — R05 Cough: Secondary | ICD-10-CM

## 2012-08-31 DIAGNOSIS — J209 Acute bronchitis, unspecified: Secondary | ICD-10-CM | POA: Insufficient documentation

## 2012-08-31 DIAGNOSIS — K219 Gastro-esophageal reflux disease without esophagitis: Secondary | ICD-10-CM | POA: Insufficient documentation

## 2012-08-31 MED ORDER — MOMETASONE FUROATE 50 MCG/ACT NA SUSP: 2.0000 | NASAL | Status: DC | PRN

## 2012-08-31 MED ORDER — OLMESARTAN MEDOXOMIL 20 MG PO TABS: 20.0000 mg | ORAL_TABLET | Freq: Every day | ORAL | Status: DC

## 2012-08-31 MED ORDER — HYDROCOD POLST-CPM POLST ER 10-8 MG PO CP12: 1.0000 | ORAL_CAPSULE | Freq: Two times a day (BID) | ORAL | Status: DC | PRN

## 2012-08-31 MED ORDER — ZOLPIDEM TARTRATE 10 MG PO TABS: 10.0000 mg | ORAL_TABLET | Freq: Every evening | ORAL | Status: DC | PRN

## 2012-08-31 MED ORDER — ATORVASTATIN CALCIUM 10 MG PO TABS: 10.0000 mg | ORAL_TABLET | Freq: Every day | ORAL | Status: DC

## 2012-08-31 MED ORDER — ESOMEPRAZOLE MAGNESIUM 40 MG PO CPDR: 40.0000 mg | DELAYED_RELEASE_CAPSULE | Freq: Every day | ORAL | Status: DC

## 2012-08-31 NOTE — Progress Notes (Signed)
Subjective:    Patient ID: Kevin Aguirre, male    DOB: 1950/12/09, 62 y.o.   MRN: [id number]  Cough This is a recurrent problem. The current episode started more than 1 month ago. The problem has been gradually worsening. The problem occurs every few hours. The cough is non-productive. Pertinent negatives include no chest pain, chills, ear congestion, ear pain, fever, headaches, heartburn, hemoptysis, myalgias, nasal congestion, postnasal drip, rash, rhinorrhea, sore throat, shortness of breath, sweats, weight loss or wheezing. He has tried OTC cough suppressant and prescription cough suppressant for the symptoms. The treatment provided no relief. There is no history of asthma, bronchiectasis, bronchitis, COPD, emphysema, environmental allergies or pneumonia.      Review of Systems  Constitutional: Negative.  Negative for fever, chills, weight loss, diaphoresis, activity change, appetite change, fatigue and unexpected weight change.  HENT: Negative.  Negative for ear pain, sore throat, rhinorrhea and postnasal drip.   Eyes: Negative.   Respiratory: Positive for cough. Negative for apnea, hemoptysis, choking, chest tightness, shortness of breath, wheezing and stridor.   Cardiovascular: Negative.  Negative for chest pain, palpitations and leg swelling.  Gastrointestinal: Negative.  Negative for heartburn, nausea, vomiting, abdominal pain, diarrhea, constipation, abdominal distention and anal bleeding.  Endocrine: Negative.   Genitourinary: Negative.   Musculoskeletal: Negative.  Negative for myalgias, back pain, joint swelling, arthralgias and gait problem.  Skin: Negative.  Negative for color change, pallor, rash and wound.  Allergic/Immunologic: Negative.  Negative for environmental allergies.  Neurological: Negative.  Negative for dizziness, weakness, light-headedness and headaches.  Hematological: Negative.  Negative for adenopathy. Does not bruise/bleed easily.  Psychiatric/Behavioral:  Negative.        Objective:   Physical Exam  Vitals reviewed. Constitutional: He is oriented to person, place, and time. He appears well-developed and well-nourished.  Non-toxic appearance. He does not have a sickly appearance. He does not appear ill. No distress.  HENT:  Head: Normocephalic and atraumatic.  Mouth/Throat: Oropharynx is clear and moist. No oropharyngeal exudate.  Eyes: Conjunctivae are normal. Right eye exhibits no discharge. Left eye exhibits no discharge. No scleral icterus.  Neck: Normal range of motion. Neck supple. No JVD present. No tracheal deviation present. No thyromegaly present.  Cardiovascular: Normal rate, regular rhythm, normal heart sounds and intact distal pulses.  Exam reveals no gallop and no friction rub.   No murmur heard. Pulmonary/Chest: Effort normal and breath sounds normal. No accessory muscle usage or stridor. Not tachypneic. No respiratory distress. He has no decreased breath sounds. He has no wheezes. He has no rhonchi. He has no rales. He exhibits no tenderness.  Abdominal: Soft. Bowel sounds are normal. He exhibits no distension and no mass. There is no tenderness. There is no rebound and no guarding.  Musculoskeletal: Normal range of motion. He exhibits no edema and no tenderness.  Lymphadenopathy:    He has no cervical adenopathy.  Neurological: He is oriented to person, place, and time.  Skin: Skin is warm and dry. No rash noted. He is not diaphoretic. No erythema. No pallor.  Psychiatric: He has a normal mood and affect. His behavior is normal. Judgment and thought content normal.     Lab Results  Component Value Date   WBC 7.6 05/15/2012   HGB 15.6 05/15/2012   HCT 44.9 05/15/2012   PLT 218.0 05/15/2012   GLUCOSE 134* 05/15/2012   CHOL 159 05/15/2012   TRIG 140.0 05/15/2012   HDL 37.00* 05/15/2012   LDLCALC 94 05/15/2012   ALT 49  05/15/2012   AST 29 05/15/2012   NA 139 05/15/2012   K 4.3 05/15/2012   CL 102 05/15/2012    CREATININE 1.2 05/15/2012   BUN 12 05/15/2012   CO2 29 05/15/2012   TSH 1.37 05/15/2012   PSA 1.10 05/15/2012   HGBA1C 6.5 05/15/2012       Assessment & Plan:

## 2012-08-31 NOTE — Patient Instructions (Signed)

## 2012-08-31 NOTE — Assessment & Plan Note (Signed)
He will have to stop the ACEI due to the cough Will control his BP with Benicar

## 2012-08-31 NOTE — Assessment & Plan Note (Addendum)
I will check his CXR to see if there is a mass, pna, etc He will stop the ACEI He will try tussicaps for the cough I don't see any evidence of asthma

## 2012-09-01 ENCOUNTER — Ambulatory Visit: Payer: BC Managed Care – PPO | Admitting: Internal Medicine

## 2012-09-04 ENCOUNTER — Telehealth: Payer: Self-pay | Admitting: Internal Medicine

## 2012-09-04 NOTE — Telephone Encounter (Signed)
[facility] Triage Call Report Triage Record Num: [id number] Operator: Freddie Breech Patient Name: Kevin Aguirre Call Date & Time: 09/03/2012 1:36:48PM Patient Phone: [phone] PCP: [facility] Patient Gender: Male PCP Fax : [phone] Patient DOB: [birth year] [facility] Name: [facility] Reason for Call: Caller: Dolan/Spouse; PCP: Sanda Linger (Adults only); CB#: [phone]; Call regarding Cough/Congestion; Pt was dx'd w/an Ace inhibitor cough. Today he is having a nonstop cough. Cannot talk to this RN his cough is so frequent. Advised [facility] now per Cough Protocol. Protocol(s) Used: Cough - Adult Recommended Outcome per Protocol: See ED Immediately Reason for Outcome: Continuous cough causing difficulty breathing Care Advice: ~ Another adult should drive. Write down provider's name. List or place the following in a bag for transport with the patient: current prescription and/or nonprescription medications; alternative treatments, therapies and medications; and street drugs.

## 2012-09-05 ENCOUNTER — Encounter: Payer: Self-pay | Admitting: Internal Medicine

## 2012-09-05 ENCOUNTER — Telehealth: Payer: Self-pay | Admitting: Internal Medicine

## 2012-09-05 ENCOUNTER — Ambulatory Visit (INDEPENDENT_AMBULATORY_CARE_PROVIDER_SITE_OTHER): Payer: BC Managed Care – PPO | Admitting: Internal Medicine

## 2012-09-05 VITALS — BP 128/62 | HR 76 | Temp 98.9°F | Ht 71.0 in | Wt 207.0 lb

## 2012-09-05 DIAGNOSIS — J209 Acute bronchitis, unspecified: Secondary | ICD-10-CM

## 2012-09-05 DIAGNOSIS — R059 Cough, unspecified: Secondary | ICD-10-CM

## 2012-09-05 DIAGNOSIS — R05 Cough: Secondary | ICD-10-CM

## 2012-09-05 MED ORDER — HYDROCODONE-HOMATROPINE 5-1.5 MG/5ML PO SYRP: 5.0000 mL | ORAL_SOLUTION | Freq: Four times a day (QID) | ORAL | Status: DC | PRN

## 2012-09-05 MED ORDER — DOXYCYCLINE HYCLATE 100 MG PO TABS: 100.0000 mg | ORAL_TABLET | Freq: Two times a day (BID) | ORAL | Status: DC

## 2012-09-05 NOTE — Progress Notes (Signed)
HPI  Pt presents to the clinic today with c/o cold symptoms x 2 weeks. The worst part is this hacking cough that produces thick green/yellow sputum.  He denies fevers but has had chills, fatigue and body aches. He has tried Robitussin, Mucinex, cough drops and nothing seems to help. The cough is worse at night. He has not had much sleep in 3 nights. He does have a history of allergies but no asthma. He does have sick contacts. He was seen by Dr. Yetta Barre a few days ago and thought the cough may be caused by the Lisinopril. He stopped the Lisinopril and started Benicar but the cough and fatigue have gotten worse.  Review of Systems      Past Medical History  Diagnosis Date  . Hypertension   . Hyperlipidemia   . GERD (gastroesophageal reflux disease)     Family History  Problem Relation Age of Onset  . Arthritis Mother   . Hyperlipidemia Mother   . Hypertension Mother   . Heart disease Neg Hx   . Cancer Neg Hx   . Early death Neg Hx   . Alcohol abuse Neg Hx   . Diabetes Neg Hx   . Kidney disease Neg Hx   . Stroke Neg Hx     History   Social History  . Marital Status: Married    Spouse Name: N/A    Number of Children: N/A  . Years of Education: N/A   Occupational History  . Not on file.   Social History Main Topics  . Smoking status: Former Games developer  . Smokeless tobacco: Never Used  . Alcohol Use: 1.8 oz/week    3 Cans of beer per week     Comment: socially  . Drug Use: No  . Sexually Active: Yes   Other Topics Concern  . Not on file   Social History Narrative  . No narrative on file    Allergies  Allergen Reactions  . Lisinopril     cough  . Erythromycin Hives and Itching     Constitutional: Positive headache, fatigue. Denies fever or abrupt weight changes.  HEENT:  Positive sore throat. Denies eye redness, eye pain, pressure behind the eyes, facial pain, nasal congestion, ear pain, ringing in the ears, wax buildup, runny nose or bloody nose. Respiratory:  Positive cough. Denies difficulty breathing or shortness of breath.  Cardiovascular: Denies chest pain, chest tightness, palpitations or swelling in the hands or feet.   No other specific complaints in a complete review of systems (except as listed in HPI above).  Objective:   BP 128/62  Pulse 76  Temp(Src) 98.9 F (37.2 C) (Oral)  Ht 5\' 11"  (1.803 m)  Wt 207 lb (93.895 kg)  BMI 28.88 kg/m2  SpO2 91% Wt Readings from Last 3 Encounters:  09/05/12 207 lb (93.895 kg)  08/31/12 205 lb (92.987 kg)  05/15/12 207 lb 8 oz (94.121 kg)     General: Appears his stated age, well developed, well nourished in NAD. HEENT: Head: normal shape and size; Eyes: sclera white, no icterus, conjunctiva pink, PERRLA and EOMs intact; Ears: Tm's gray and intact, normal light reflex; Nose: mucosa pink and moist, septum midline; Throat/Mouth: + PND. Teeth present, mucosa erythematous and moist, no exudate noted, no lesions or ulcerations noted.  Neck: Mild cervical lymphadenopathy. Neck supple, trachea midline. No massses, lumps or thyromegaly present.  Cardiovascular: Normal rate and rhythm. S1,S2 noted.  No murmur, rubs or gallops noted. No JVD or BLE edema.  No carotid bruits noted. Pulmonary/Chest: Normal effort and scattered rhonchi throughout. No respiratory distress. No wheezes, rales noted.      Assessment & Plan:   Acute Bronchitis  Get some rest and drink plenty of water Do salt water gargles for the sore throat eRx for Doxycycline x 10 days eRx for Hycodan cough syrup  RTC as needed or if symptoms persist.

## 2012-09-05 NOTE — Telephone Encounter (Signed)
[facility] Triage Call Report Triage Record Num: [id number] Operator: Geanie Berlin Patient Name: Kevin Aguirre Call Date & Time: 09/04/2012 5:08:10PM Patient Phone: [phone] PCP: [place] [facility] Patient Gender: Male PCP Fax : [phone] Patient DOB: Dec 16, 1950 Practice Name: [facility] Reason for Call: Caller: [name]/Patient; PCP: Sanda Linger (Adults only); CB#: [phone]; Call regarding Cough/Congestion; Seen 08/30/12 for dry cough; MD stopped Lisinopril. Cough is less frequent but now productive with green/yellow phlem. Afebrile. Cough onset: 08/21/12. Advised to see MD within 24 hours for productive cough with colored sputum per Cough guideline. Appointment scheduled for 0800 09/05/12 with Pamala Hurry, NP. Protocol(s) Used: Cough - Adult Recommended Outcome per Protocol: See Provider within 24 hours Reason for Outcome: Productive cough with colored sputum (other than clear or white sputum) Care Advice: ~ Use a cool mist humidifier to moisten air. Be sure to clean according to manufacturer's instructions. Limit or avoid exposure to irritants and allergens (e.g. air pollution, smoke/smoking, chemicals, dust, pollen, pet dander, etc.) ~Call provider if fever greater than 101.5 F (38.6 C) or 100.5 F (38.1C) in an immunocompromised patient (such as diabetes, HIV/AIDS, renal disease, chemotherapy, organ transplant, or chronic steroid use) has not improved in 24 hours. ~Drink more fluids -- water, low-sugar juices, tea and warm soup, especially chicken broth, are options. Avoid caffeinated or alcoholic beverages because they can increase the chance of dehydration. ~ SYMPTOM / CONDITION MANAGEMENT Coughing up mucus or phlegm helps to get rid of an infection. A productive cough should not be stopped. A cough medicine with guaifenesin (Robitussin, Mucinex) can help loosen the mucus. Cough medicine with dextromethorphan (DM) should be avoided. Drinking lots of fluids can help loosen  the mucus too, especially warm fluids.

## 2012-09-05 NOTE — Patient Instructions (Signed)

## 2012-09-11 ENCOUNTER — Ambulatory Visit (INDEPENDENT_AMBULATORY_CARE_PROVIDER_SITE_OTHER): Payer: BC Managed Care – PPO | Admitting: Internal Medicine

## 2012-09-11 ENCOUNTER — Encounter: Payer: Self-pay | Admitting: *Deleted

## 2012-09-11 ENCOUNTER — Other Ambulatory Visit (INDEPENDENT_AMBULATORY_CARE_PROVIDER_SITE_OTHER): Payer: BC Managed Care – PPO

## 2012-09-11 ENCOUNTER — Encounter (HOSPITAL_COMMUNITY): Payer: Self-pay | Admitting: *Deleted

## 2012-09-11 ENCOUNTER — Encounter: Payer: Self-pay | Admitting: Internal Medicine

## 2012-09-11 ENCOUNTER — Inpatient Hospital Stay (HOSPITAL_COMMUNITY)
Admission: EM | Admit: 2012-09-11 | Discharge: 2012-09-13 | DRG: 097 | Disposition: A | Discharge status: Home / Self Care | Payer: BC Managed Care – PPO | Attending: Internal Medicine | Admitting: Internal Medicine

## 2012-09-11 ENCOUNTER — Encounter: Payer: Self-pay | Admitting: Emergency Medicine

## 2012-09-11 ENCOUNTER — Ambulatory Visit: Payer: BC Managed Care – PPO | Admitting: Internal Medicine

## 2012-09-11 ENCOUNTER — Ambulatory Visit (INDEPENDENT_AMBULATORY_CARE_PROVIDER_SITE_OTHER)
Admission: RE | Admit: 2012-09-11 | Discharge: 2012-09-11 | Disposition: A | Discharge status: Home / Self Care | Payer: BC Managed Care – PPO | Source: Ambulatory Visit | Attending: Internal Medicine | Admitting: Internal Medicine

## 2012-09-11 VITALS — BP 136/88 | HR 90 | Temp 97.5°F | Ht 71.0 in | Wt 200.5 lb

## 2012-09-11 DIAGNOSIS — R7309 Other abnormal glucose: Secondary | ICD-10-CM

## 2012-09-11 DIAGNOSIS — I2510 Atherosclerotic heart disease of native coronary artery without angina pectoris: Secondary | ICD-10-CM

## 2012-09-11 DIAGNOSIS — J209 Acute bronchitis, unspecified: Secondary | ICD-10-CM

## 2012-09-11 DIAGNOSIS — I10 Essential (primary) hypertension: Secondary | ICD-10-CM | POA: Diagnosis present

## 2012-09-11 DIAGNOSIS — R05 Cough: Secondary | ICD-10-CM

## 2012-09-11 DIAGNOSIS — M545 Low back pain: Secondary | ICD-10-CM

## 2012-09-11 DIAGNOSIS — R059 Cough, unspecified: Secondary | ICD-10-CM

## 2012-09-11 DIAGNOSIS — J309 Allergic rhinitis, unspecified: Secondary | ICD-10-CM

## 2012-09-11 DIAGNOSIS — Z87891 Personal history of nicotine dependence: Secondary | ICD-10-CM

## 2012-09-11 DIAGNOSIS — R9431 Abnormal electrocardiogram [ECG] [EKG]: Secondary | ICD-10-CM

## 2012-09-11 DIAGNOSIS — K219 Gastro-esophageal reflux disease without esophagitis: Secondary | ICD-10-CM

## 2012-09-11 DIAGNOSIS — Z0000 Encounter for general adult medical examination without abnormal findings: Secondary | ICD-10-CM

## 2012-09-11 DIAGNOSIS — E785 Hyperlipidemia, unspecified: Secondary | ICD-10-CM

## 2012-09-11 DIAGNOSIS — R0602 Shortness of breath: Secondary | ICD-10-CM

## 2012-09-11 DIAGNOSIS — IMO0002 Reserved for concepts with insufficient information to code with codable children: Secondary | ICD-10-CM

## 2012-09-11 DIAGNOSIS — I447 Left bundle-branch block, unspecified: Secondary | ICD-10-CM | POA: Diagnosis present

## 2012-09-11 LAB — COMPREHENSIVE METABOLIC PANEL
BUN: 12 mg/dL (ref 6–23)
Calcium: 9.1 mg/dL (ref 8.4–10.5)
GFR calc Af Amer: >90 mL/min (ref >90)
Glucose, Bld: 189 mg/dL — ABNORMAL HIGH (ref 70–99)
Sodium: 134 mEq/L — ABNORMAL LOW (ref 135–145)
Total Protein: 7.4 g/dL (ref 6.0–8.3)

## 2012-09-11 LAB — CBC WITH DIFFERENTIAL/PLATELET
Eosinophils Absolute: 0.6 10*3/uL (ref 0.0–0.7)
Eosinophils Relative: 5 % (ref 0–5)
Lymphs Abs: 1.1 10*3/uL (ref 0.7–4.0)
MCH: 33.5 pg (ref 26.0–34.0)
MCV: 92.6 fL (ref 78.0–100.0)
Monocytes Relative: 4 % (ref 3–12)
Platelets: 215 10*3/uL (ref 150–400)
RBC: 4.57 MIL/uL (ref 4.22–5.81)

## 2012-09-11 LAB — POCT I-STAT 3, ART BLOOD GAS (G3+)
Patient temperature: 97.2
pH, Arterial: 7.413 (ref 7.350–7.450)

## 2012-09-11 LAB — BASIC METABOLIC PANEL
BUN: 12 mg/dL (ref 6–23)
CO2: 28 mEq/L (ref 19–32)
Chloride: 100 mEq/L (ref 96–112)
Creatinine, Ser: 1 mg/dL (ref 0.4–1.5)
Glucose, Bld: 114 mg/dL — ABNORMAL HIGH (ref 70–99)

## 2012-09-11 LAB — CBC
Hemoglobin: 15.5 g/dL (ref 13.0–17.0)
Platelets: 246 10*3/uL (ref 150.0–400.0)
RBC: 4.74 Mil/uL (ref 4.22–5.81)
RDW: 14.1 % (ref 11.5–14.6)
WBC: 11.2 10*3/uL — ABNORMAL HIGH (ref 4.5–10.5)

## 2012-09-11 LAB — PRO B NATRIURETIC PEPTIDE: Pro B Natriuretic peptide (BNP): 59.3 pg/mL (ref 0–125)

## 2012-09-11 LAB — POCT I-STAT TROPONIN I: Troponin i, poc: 0 ng/mL (ref 0.00–0.08)

## 2012-09-11 MED ORDER — ALBUTEROL SULFATE (5 MG/ML) 0.5% IN NEBU: 2.5000 mg | INHALATION_SOLUTION | RESPIRATORY_TRACT | Status: DC | PRN

## 2012-09-11 MED ORDER — ASPIRIN EC 81 MG PO TBEC
81.0000 mg | DELAYED_RELEASE_TABLET | Freq: Every day | ORAL | Status: DC
  Administered 2012-09-11 – 2012-09-12 (×2): 81 mg via ORAL
  Filled 2012-09-11 (×3): qty 1

## 2012-09-11 MED ORDER — METHYLPREDNISOLONE SODIUM SUCC 40 MG IJ SOLR
40.0000 mg | Freq: Every day | INTRAMUSCULAR | Status: DC
  Administered 2012-09-12 – 2012-09-13 (×2): 40 mg via INTRAVENOUS
  Filled 2012-09-11 (×2): qty 1

## 2012-09-11 MED ORDER — PANTOPRAZOLE SODIUM 40 MG PO TBEC
40.0000 mg | DELAYED_RELEASE_TABLET | Freq: Every day | ORAL | Status: DC
  Administered 2012-09-12 – 2012-09-13 (×2): 40 mg via ORAL
  Filled 2012-09-11 (×2): qty 1

## 2012-09-11 MED ORDER — SODIUM CHLORIDE 0.9 % IJ SOLN
3.0000 mL | Freq: Two times a day (BID) | INTRAMUSCULAR | Status: DC
  Administered 2012-09-12 – 2012-09-13 (×3): 3 mL via INTRAVENOUS

## 2012-09-11 MED ORDER — METHYLPREDNISOLONE ACETATE 80 MG/ML IJ SUSP
80.0000 mg | Freq: Once | INTRAMUSCULAR | Status: AC
  Administered 2012-09-11: 80 mg via INTRAMUSCULAR

## 2012-09-11 MED ORDER — LORATADINE 10 MG PO TABS
10.0000 mg | ORAL_TABLET | Freq: Every day | ORAL | Status: DC
  Administered 2012-09-12 – 2012-09-13 (×2): 10 mg via ORAL
  Filled 2012-09-11 (×2): qty 1

## 2012-09-11 MED ORDER — ONDANSETRON HCL 4 MG PO TABS: 4.0000 mg | ORAL_TABLET | Freq: Four times a day (QID) | ORAL | Status: DC | PRN

## 2012-09-11 MED ORDER — FLUTICASONE PROPIONATE 50 MCG/ACT NA SUSP
1.0000 | Freq: Every day | NASAL | Status: DC
  Administered 2012-09-12 – 2012-09-13 (×2): 1 via NASAL
  Filled 2012-09-11: qty 16

## 2012-09-11 MED ORDER — ALBUTEROL SULFATE (5 MG/ML) 0.5% IN NEBU
10.0000 mg | INHALATION_SOLUTION | RESPIRATORY_TRACT | Status: AC
  Administered 2012-09-11: 10 mg via RESPIRATORY_TRACT
  Filled 2012-09-11: qty 2

## 2012-09-11 MED ORDER — LEVOFLOXACIN IN D5W 500 MG/100ML IV SOLN
500.0000 mg | Freq: Once | INTRAVENOUS | Status: AC
  Administered 2012-09-12: 500 mg via INTRAVENOUS
  Filled 2012-09-11: qty 100

## 2012-09-11 MED ORDER — GUAIFENESIN-CODEINE 100-10 MG/5ML PO SOLN
10.0000 mL | Freq: Once | ORAL | Status: AC
  Administered 2012-09-11: 10 mL via ORAL
  Filled 2012-09-11: qty 10

## 2012-09-11 MED ORDER — IPRATROPIUM BROMIDE 0.02 % IN SOLN
0.5000 mg | Freq: Four times a day (QID) | RESPIRATORY_TRACT | Status: DC
  Administered 2012-09-11 – 2012-09-12 (×2): 0.5 mg via RESPIRATORY_TRACT
  Filled 2012-09-11 (×2): qty 2.5

## 2012-09-11 MED ORDER — ZOLPIDEM TARTRATE 5 MG PO TABS: 10.0000 mg | ORAL_TABLET | Freq: Every evening | ORAL | Status: DC | PRN

## 2012-09-11 MED ORDER — PREDNISONE 10 MG PO TABS: ORAL_TABLET | ORAL | Status: DC

## 2012-09-11 MED ORDER — ATORVASTATIN CALCIUM 10 MG PO TABS
10.0000 mg | ORAL_TABLET | Freq: Every day | ORAL | Status: DC
  Administered 2012-09-12 – 2012-09-13 (×2): 10 mg via ORAL
  Filled 2012-09-11 (×2): qty 1

## 2012-09-11 MED ORDER — LEVOFLOXACIN 500 MG PO TABS: 500.0000 mg | ORAL_TABLET | Freq: Every day | ORAL | Status: DC

## 2012-09-11 MED ORDER — SODIUM CHLORIDE 0.9 % IJ SOLN
3.0000 mL | Freq: Two times a day (BID) | INTRAMUSCULAR | Status: DC
  Administered 2012-09-11 – 2012-09-12 (×2): 3 mL via INTRAVENOUS

## 2012-09-11 MED ORDER — LEVOFLOXACIN IN D5W 500 MG/100ML IV SOLN
500.0000 mg | INTRAVENOUS | Status: DC
  Administered 2012-09-12: 500 mg via INTRAVENOUS
  Filled 2012-09-11 (×2): qty 100

## 2012-09-11 MED ORDER — ENOXAPARIN SODIUM 40 MG/0.4ML ~~LOC~~ SOLN
40.0000 mg | SUBCUTANEOUS | Status: DC
  Administered 2012-09-11 – 2012-09-12 (×2): 40 mg via SUBCUTANEOUS
  Filled 2012-09-11 (×3): qty 0.4

## 2012-09-11 MED ORDER — IRBESARTAN 150 MG PO TABS
150.0000 mg | ORAL_TABLET | Freq: Every day | ORAL | Status: DC
  Administered 2012-09-12 – 2012-09-13 (×2): 150 mg via ORAL
  Filled 2012-09-11 (×2): qty 1

## 2012-09-11 MED ORDER — BUDESONIDE 0.25 MG/2ML IN SUSP
0.2500 mg | Freq: Two times a day (BID) | RESPIRATORY_TRACT | Status: DC
  Administered 2012-09-11 – 2012-09-13 (×4): 0.25 mg via RESPIRATORY_TRACT
  Filled 2012-09-11 (×6): qty 2

## 2012-09-11 MED ORDER — ACETAMINOPHEN 650 MG RE SUPP: 650.0000 mg | Freq: Four times a day (QID) | RECTAL | Status: DC | PRN

## 2012-09-11 MED ORDER — ALBUTEROL SULFATE HFA 108 (90 BASE) MCG/ACT IN AERS
2.0000 | INHALATION_SPRAY | Freq: Four times a day (QID) | RESPIRATORY_TRACT | Status: DC | PRN
Start: 2012-09-11 — End: 2012-09-13
  Filled 2012-09-11: qty 6.7

## 2012-09-11 MED ORDER — ONDANSETRON HCL 4 MG/2ML IJ SOLN: 4.0000 mg | Freq: Four times a day (QID) | INTRAMUSCULAR | Status: DC | PRN

## 2012-09-11 MED ORDER — ACETAMINOPHEN 325 MG PO TABS: 650.0000 mg | ORAL_TABLET | Freq: Four times a day (QID) | ORAL | Status: DC | PRN

## 2012-09-11 MED ORDER — SIMETHICONE 80 MG PO CHEW
80.0000 mg | CHEWABLE_TABLET | Freq: Four times a day (QID) | ORAL | Status: DC | PRN
  Filled 2012-09-11: qty 1

## 2012-09-11 MED ORDER — ALBUTEROL SULFATE (5 MG/ML) 0.5% IN NEBU
2.5000 mg | INHALATION_SOLUTION | Freq: Four times a day (QID) | RESPIRATORY_TRACT | Status: DC
  Administered 2012-09-11 – 2012-09-12 (×4): 2.5 mg via RESPIRATORY_TRACT
  Filled 2012-09-11 (×4): qty 0.5

## 2012-09-11 NOTE — ED Provider Notes (Signed)
History     CSN: [id number]  Arrival date & time 09/11/12  1558   First MD Initiated Contact with Patient 09/11/12 1623      Chief Complaint  Patient presents with  . Shortness of Breath    (Consider location/radiation/quality/duration/timing/severity/associated sxs/prior treatment) HPI Comments: Patient comes to the ER for evaluation of worsening difficulty breathing. Patient reports that he has been sick for approximately 2 weeks. Patient has had persistent and worsening cough. Cough is mostly nonproductive. He has not had any fever. Patient was seen by his primary care doctor earlier today and had an x-ray which was negative. He recently had bronchitis, started on prednisone. Patient reports since leaving the office his breathing has worsened.  Patient is a 62 y.o. male presenting with shortness of breath.  Shortness of Breath Associated symptoms: wheezing   Associated symptoms: no fever     Past Medical History  Diagnosis Date  . Hypertension   . Hyperlipidemia   . GERD (gastroesophageal reflux disease)     History reviewed. No pertinent past surgical history.  Family History  Problem Relation Age of Onset  . Arthritis Mother   . Hyperlipidemia Mother   . Hypertension Mother   . Heart disease Neg Hx   . Cancer Neg Hx   . Early death Neg Hx   . Alcohol abuse Neg Hx   . Diabetes Neg Hx   . Kidney disease Neg Hx   . Stroke Neg Hx     History  Substance Use Topics  . Smoking status: Former Games developer  . Smokeless tobacco: Never Used  . Alcohol Use: 1.8 oz/week    3 Cans of beer per week     Comment: socially      Review of Systems  Constitutional: Negative for fever.  Respiratory: Positive for shortness of breath and wheezing.   All other systems reviewed and are negative.    Allergies  Lisinopril; Erythromycin; and Doxycycline  Home Medications   Current Outpatient Rx  Name  Route  Sig  Dispense  Refill  . albuterol (PROVENTIL HFA;VENTOLIN HFA) 108  (90 BASE) MCG/ACT inhaler   Inhalation   Inhale 2 puffs into the lungs every 6 (six) hours as needed. For allergies.         Marland Kitchen aspirin EC 81 MG tablet   Oral   Take 81 mg by mouth at bedtime.         Marland Kitchen atorvastatin (LIPITOR) 10 MG tablet   Oral   Take 1 tablet (10 mg total) by mouth daily.   90 tablet   3   . cetirizine (ZYRTEC ALLERGY) 10 MG tablet   Oral   Take 10 mg by mouth daily.         Marland Kitchen esomeprazole (NEXIUM) 40 MG capsule   Oral   Take 1 capsule (40 mg total) by mouth daily before breakfast. ID# [id number]   90 capsule   3   . Hydrocod Polst-Chlorphen Polst 10-8 MG CP12   Oral   Take 1 tablet by mouth 2 (two) times daily as needed.   25 each   0   . HYDROcodone-homatropine (HYCODAN) 5-1.5 MG/5ML syrup   Oral   Take 5 mLs by mouth every 6 (six) hours as needed for cough.   480 mL   0   . levofloxacin (LEVAQUIN) 500 MG tablet   Oral   Take 1 tablet (500 mg total) by mouth daily.   7 tablet   0   .  mometasone (NASONEX) 50 MCG/ACT nasal spray   Nasal   Place 2 sprays into the nose as needed. Use before flying.   51 g   3   . olmesartan (BENICAR) 20 MG tablet   Oral   Take 1 tablet (20 mg total) by mouth daily.   84 tablet   0   . predniSONE (DELTASONE) 10 MG tablet      Take 3 tablets on days 1-3, take 2 tablets on days 4-6, take 1 tablet on days 7-9   18 tablet   0   . simethicone (MYLICON) 80 MG chewable tablet   Oral   Chew 80 mg by mouth every 6 (six) hours as needed. For gas.         . zolpidem (AMBIEN) 10 MG tablet   Oral   Take 1 tablet (10 mg total) by mouth at bedtime as needed. For sleep.   90 tablet   1     BP 160/92  Pulse 82  Temp(Src) 97.2 F (36.2 C) (Oral)  Resp 22  SpO2 93%  Physical Exam  Constitutional: He is oriented to person, place, and time. He appears well-developed and well-nourished. No distress.  HENT:  Head: Normocephalic and atraumatic.  Right Ear: Hearing normal.  Nose: Nose normal.   Mouth/Throat: Oropharynx is clear and moist and mucous membranes are normal.  Eyes: Conjunctivae and EOM are normal. Pupils are equal, round, and reactive to light.  Neck: Normal range of motion. Neck supple.  Cardiovascular: Normal rate, regular rhythm, S1 normal and S2 normal.  Exam reveals no gallop and no friction rub.   No murmur heard. Pulmonary/Chest: Effort normal. No respiratory distress. He has wheezes. He exhibits no tenderness.  Abdominal: Soft. Normal appearance and bowel sounds are normal. There is no hepatosplenomegaly. There is no tenderness. There is no rebound, no guarding, no tenderness at McBurney's point and negative Murphy's sign. No hernia.  Musculoskeletal: Normal range of motion.  Neurological: He is alert and oriented to person, place, and time. He has normal strength. No cranial nerve deficit or sensory deficit. Coordination normal. GCS eye subscore is 4. GCS verbal subscore is 5. GCS motor subscore is 6.  Skin: Skin is warm, dry and intact. No rash noted. No cyanosis.  Psychiatric: He has a normal mood and affect. His speech is normal and behavior is normal. Thought content normal.    ED Course  Procedures (including critical care time)  EKG:  Date: 09/11/2012  Rate: 93  Rhythm: normal sinus rhythm  QRS Axis: normal  Intervals: normal  ST/T Wave abnormalities: LBBB  Conduction Disutrbances:left bundle branch block  Narrative Interpretation:   Old EKG Reviewed: unchanged    Labs Reviewed  CBC WITH DIFFERENTIAL  COMPREHENSIVE METABOLIC PANEL  PRO B NATRIURETIC PEPTIDE   Dg Chest 2 View  09/11/2012  *RADIOLOGY REPORT*  Clinical Data: Cough for 3 weeks  CHEST - 2 VIEW  Comparison: 08/31/2012  Findings: Heart size is normal.  No pleural effusion or edema identified.  No airspace consolidation noted.  Review of the visualized osseous structures is unremarkable.  Mild spondylosis identified within the thoracic spine.  IMPRESSION:  1.  No acute cardiopulmonary  abnormalities. 2.  Thoracic spondylosis noted.   Original Report Authenticated By: Signa Kell, M.D.      Diagnosis: Asthmatic bronchitis with significant hypoxia    MDM  Patient comes to the ER for evaluation of persistent shortness of breath. Patient has been sick for 2 weeks. He does  have an albuterol inhaler that he has used in the past occasionally. He has been using it recently without improvement. He saw his primary care physician at [facility] internal medicine today and had an x-ray. Results were reviewed, no pneumonia. Patient was administered IM corticosteroids and initiated on oral prednisone but has not improved. At arrival to the ER, patient was mildly hypoxic with significant bronchospasm. Patient was administered continuous albuterol for 1 hour. After this treatment, patient objectively felt better. Examination revealed significantly diminished wheezing, but he is now moderately hypoxic. At rest on room air, O2 saturation dropped into the 70s. Patient will require hospitalization for further management.  Patient also received corticosteroids by the primary care doctor. He also started Levaquin 750 mg by mouth earlier today.      Gilda Crease, MD 09/11/12 2017

## 2012-09-11 NOTE — ED Notes (Addendum)
Pt removed from O2. Tolerating well but O2 levels at 90%. Placed on 2L and O2 increased to 96%.

## 2012-09-11 NOTE — Patient Instructions (Signed)

## 2012-09-11 NOTE — ED Notes (Signed)
MD at bedside. 

## 2012-09-11 NOTE — ED Notes (Signed)
PT is here with shortness of breath after seeing provider at [facility] clinic.  Pt was diagnosed with bronchitis and was told it was negative for pneumonia.  Pt reports that shortness of breath had got worse earlier and he could not get his breath. Pt appears weak and sat 91% on RA and placed on oxygen

## 2012-09-11 NOTE — Progress Notes (Signed)
HPI  Pt presents to the clinic today with c/o continuing cold symptoms that has been going on for 3 weeks. He was diagnosed with bronchitis about 10 days ago. He took a course of doxycycline but is still not any better. He did develop a allergic reaction to the doxycycline so he stopped taking in on Saturday. The cough, shortness of breath and fatigue are worse.  He does have a history of allergies but no asthma. He does have sick contacts. He was seen by Dr. Yetta Barre a 2 weeks ago and thought the cough may be caused by the Lisinopril. He stopped the Lisinopril and started Benicar but the cough and fatigue have gotten worse.  Review of Systems      Past Medical History  Diagnosis Date  . Hypertension   . Hyperlipidemia   . GERD (gastroesophageal reflux disease)     Family History  Problem Relation Age of Onset  . Arthritis Mother   . Hyperlipidemia Mother   . Hypertension Mother   . Heart disease Neg Hx   . Cancer Neg Hx   . Early death Neg Hx   . Alcohol abuse Neg Hx   . Diabetes Neg Hx   . Kidney disease Neg Hx   . Stroke Neg Hx     History   Social History  . Marital Status: Married    Spouse Name: N/A    Number of Children: N/A  . Years of Education: N/A   Occupational History  . Not on file.   Social History Main Topics  . Smoking status: Former Games developer  . Smokeless tobacco: Never Used  . Alcohol Use: 1.8 oz/week    3 Cans of beer per week     Comment: socially  . Drug Use: No  . Sexually Active: Yes   Other Topics Concern  . Not on file   Social History Narrative  . No narrative on file    Allergies  Allergen Reactions  . Lisinopril     cough  . Erythromycin Hives and Itching     Constitutional: Positive headache, fatigue. Denies fever or abrupt weight changes.  HEENT:  Positive sore throat. Denies eye redness, eye pain, pressure behind the eyes, facial pain, nasal congestion, ear pain, ringing in the ears, wax buildup, runny nose or bloody  nose. Respiratory: Positive cough. Denies difficulty breathing or shortness of breath.  Cardiovascular: Denies chest pain, chest tightness, palpitations or swelling in the hands or feet.   No other specific complaints in a complete review of systems (except as listed in HPI above).  Objective:   There were no vitals taken for this visit. Wt Readings from Last 3 Encounters:  09/05/12 207 lb (93.895 kg)  08/31/12 205 lb (92.987 kg)  05/15/12 207 lb 8 oz (94.121 kg)     General: Appears his stated age, well developed, well nourished in NAD. HEENT: Head: normal shape and size; Eyes: sclera white, no icterus, conjunctiva pink, PERRLA and EOMs intact; Ears: Tm's gray and intact, normal light reflex; Nose: mucosa pink and moist, septum midline; Throat/Mouth: + PND. Teeth present, mucosa erythematous and moist, no exudate noted, no lesions or ulcerations noted.  Neck: Mild cervical lymphadenopathy. Neck supple, trachea midline. No massses, lumps or thyromegaly present.  Cardiovascular: Normal rate and rhythm. S1,S2 noted.  No murmur, rubs or gallops noted. No JVD or BLE edema. No carotid bruits noted. Pulmonary/Chest: Normal effort and scattered rhonchi throughout. No respiratory distress. No wheezes, rales noted.  Assessment & Plan:   Acute Bronchitis, unresolved concerning for pneumonia:  Will obtain chest xray to r/o infection eRx for pred taper Stop doxycycline, will add to allergy list 80 mg Depo IM today eRx for Levaquin x 7 days  RTC as needed or if symptoms persist.

## 2012-09-11 NOTE — Progress Notes (Signed)
ANTIBIOTIC CONSULT NOTE - INITIAL  Pharmacy Consult for levofloxacin Indication: Bronchitis  Allergies  Allergen Reactions  . Lisinopril     cough  . Erythromycin Hives and Itching  . Doxycycline Rash    Patient Measurements: Height: 5\' 11"  (180.3 cm) Weight: 200 lb 6.4 oz (90.9 kg) IBW/kg (Calculated) : 75.3  Vital Signs: Temp: 97.2 F (36.2 C) (04/21 1600) Temp src: Oral (04/21 1600) BP: 157/81 mmHg (04/21 2113) Pulse Rate: 93 (04/21 2113) Intake/Output from previous day:   Intake/Output from this shift:    Labs:  Recent Labs  09/11/12 1324 09/11/12 1605  WBC 11.2* 11.0*  HGB 15.5 15.3  PLT 246.0 215  CREATININE 1.0 0.86   Estimated Creatinine Clearance: 101.3 ml/min (by C-G formula based on Cr of 0.86). No results found for this basename: VANCOTROUGH, VANCOPEAK, VANCORANDOM, GENTTROUGH, GENTPEAK, GENTRANDOM, TOBRATROUGH, TOBRAPEAK, TOBRARND, AMIKACINPEAK, AMIKACINTROU, AMIKACIN,  in the last 72 hours   Microbiology: No results found for this or any previous visit (from the past 720 hour(s)).  Medical History: Past Medical History  Diagnosis Date  . Hypertension   . Hyperlipidemia   . GERD (gastroesophageal reflux disease)     Medications:  Scheduled:  . [START ON 09/12/2012] albuterol  2.5 mg Nebulization Q6H  . aspirin EC  81 mg Oral QHS  . [START ON 09/12/2012] atorvastatin  10 mg Oral Daily  . budesonide (PULMICORT) nebulizer solution  0.25 mg Nebulization BID  . enoxaparin (LOVENOX) injection  40 mg Subcutaneous Q24H  . [START ON 09/12/2012] fluticasone  1 spray Each Nare Daily  . guaiFENesin-codeine  10 mL Oral Once  . [START ON 09/12/2012] ipratropium  0.5 mg Nebulization Q6H  . [START ON 09/12/2012] irbesartan  150 mg Oral Daily  . [START ON 09/12/2012] loratadine  10 mg Oral Daily  . [START ON 09/12/2012] methylPREDNISolone (SOLU-MEDROL) injection  40 mg Intravenous Daily  . [START ON 09/12/2012] pantoprazole  40 mg Oral QAC breakfast  . sodium  chloride  3 mL Intravenous Q12H  . sodium chloride  3 mL Intravenous Q12H   Assessment: 62 yo male presented with bronchitis. Pharmacy to manage levofloxacin.   Plan:  1. Levofloxacin 500mg  IV Q24H.   Emeline Gins 09/11/2012,11:25 PM

## 2012-09-11 NOTE — H&P (Signed)
[facility] History and Physical  Bernon Arviso ZOX:[id number] DOB: [birth year] DOA: 09/11/2012  Referring physician: Dr. Franky Macho. PCP: Sanda Linger, MD  Specialists: None.  Chief Complaint: Shortness of breath.  HPI: Kevin Aguirre is a 62 y.o. male who has just recently returned from [place] after being retired from his regular job presents with complaints of shortness of breath. Patient has been having cough for last 2 weeks and had gone to his PCP last week on Wednesday that's 5 days ago and was prescribed doxycycline. Despite taking which patient whowas still having cough and also had developed a rash on his abdomen 2 days ago. Today patient had gone to his PCP again because along with his cough patient also developed worsening shortness of breath with wheezing. Denies any chest pain or any fever and chills. Patient was given Levaquin. But due to worsening shortness of breath patient presented to the ER and was found to be wheezing. Patient easily gets hypoxic and desaturates. Patient was placed on steroids and nebulizer and antibiotics and has been admitted for shortness of breath which is most likely from bronchitis.  Review of Systems: As presented in the history of presenting illness, rest negative.  Past Medical History  Diagnosis Date  . Hypertension   . Hyperlipidemia   . GERD (gastroesophageal reflux disease)    Past Surgical History  Procedure Laterality Date  . Tonsillectomy     Social History:  reports that he has quit smoking. He has never used smokeless tobacco. He reports that he drinks about 1.8 ounces of alcohol per week. He reports that he does not use illicit drugs. Lives at home. where does patient live-- Can do ADLs. Can patient participate in ADLs?  Allergies  Allergen Reactions  . Lisinopril     cough  . Erythromycin Hives and Itching  . Doxycycline Rash    Family History  Problem Relation Age of Onset  . Arthritis Mother   . Hyperlipidemia  Mother   . Hypertension Mother   . Heart disease Neg Hx   . Cancer Neg Hx   . Early death Neg Hx   . Alcohol abuse Neg Hx   . Diabetes Neg Hx   . Kidney disease Neg Hx   . Stroke Neg Hx       Prior to Admission medications   Medication Sig Start Date End Date Taking? Authorizing Provider  albuterol (PROVENTIL HFA;VENTOLIN HFA) 108 (90 BASE) MCG/ACT inhaler Inhale 2 puffs into the lungs every 6 (six) hours as needed. For allergies.   Yes Historical Provider, MD  aspirin EC 81 MG tablet Take 81 mg by mouth at bedtime.   Yes Historical Provider, MD  atorvastatin (LIPITOR) 10 MG tablet Take 1 tablet (10 mg total) by mouth daily. 08/31/12  Yes Etta Grandchild, MD  cetirizine (ZYRTEC ALLERGY) 10 MG tablet Take 10 mg by mouth daily.   Yes Historical Provider, MD  esomeprazole (NEXIUM) 40 MG capsule Take 1 capsule (40 mg total) by mouth daily before breakfast. ID# [id number] 08/31/12  Yes Etta Grandchild, MD  Hydrocod Polst-Chlorphen Polst 10-8 MG CP12 Take 1 tablet by mouth 2 (two) times daily as needed. 08/31/12  Yes Etta Grandchild, MD  HYDROcodone-homatropine [facility]) 5-1.5 MG/5ML syrup Take 5 mLs by mouth every 6 (six) hours as needed for cough. 09/05/12  Yes Nicki Reaper, NP  levofloxacin (LEVAQUIN) 500 MG tablet Take 1 tablet (500 mg total) by mouth daily. 09/11/12  Yes Nicki Reaper, NP  mometasone (NASONEX) 50  MCG/ACT nasal spray Place 2 sprays into the nose as needed. Use before flying. 08/31/12  Yes Etta Grandchild, MD  olmesartan (BENICAR) 20 MG tablet Take 1 tablet (20 mg total) by mouth daily. 08/31/12  Yes Etta Grandchild, MD  predniSONE (DELTASONE) 10 MG tablet Take 3 tablets on days 1-3, take 2 tablets on days 4-6, take 1 tablet on days 7-9 09/11/12  Yes Nicki Reaper, NP  simethicone (MYLICON) 80 MG chewable tablet Chew 80 mg by mouth every 6 (six) hours as needed. For gas.   Yes Historical Provider, MD  zolpidem (AMBIEN) 10 MG tablet Take 1 tablet (10 mg total) by mouth at bedtime as needed.  For sleep. 08/31/12  Yes Etta Grandchild, MD   Physical Exam: Filed Vitals:   09/11/12 1700 09/11/12 1800 09/11/12 2000 09/11/12 2113  BP: 155/93 144/85 149/83 157/81  Pulse: 78 79 95 93  Temp:      TempSrc:      Resp: 24 21 24 18   SpO2: 94% 92% 86% 95%     General:  Well-developed well-nourished.  Eyes: Anicteric no pallor.  ENT: No discharge from the ears eyes nose and mouth.  Neck: No mass felt.  Cardiovascular: S1-S2 heard.  Respiratory: No rhonchi or crepitations.  Abdomen: Soft nontender bowel sounds present.  Skin: No rash.  Musculoskeletal: No edema.  Psychiatric: Appears normal.  Neurologic: Alert awake oriented to time place and person. Moves all extremities.  Labs on Admission:  Basic Metabolic Panel:  Recent Labs Lab 09/11/12 1324 09/11/12 1605  NA 137 134*  K 4.3 4.1  CL 100 97  CO2 28 28  GLUCOSE 114* 189*  BUN 12 12  CREATININE 1.0 0.86  CALCIUM 8.7 9.1   Liver Function Tests:  Recent Labs Lab 09/11/12 1605  AST 31  ALT 34  ALKPHOS 102  BILITOT 0.7  PROT 7.4  ALBUMIN 4.0   No results found for this basename: LIPASE, AMYLASE,  in the last 168 hours No results found for this basename: AMMONIA,  in the last 168 hours CBC:  Recent Labs Lab 09/11/12 1324 09/11/12 1605  WBC 11.2* 11.0*  NEUTROABS  --  8.8*  HGB 15.5 15.3  HCT 45.5 42.3  MCV 95.9 92.6  PLT 246.0 215   Cardiac Enzymes: No results found for this basename: CKTOTAL, CKMB, CKMBINDEX, TROPONINI,  in the last 168 hours  BNP (last 3 results)  Recent Labs  09/11/12 1605  PROBNP 59.3   CBG: No results found for this basename: GLUCAP,  in the last 168 hours  Radiological Exams on Admission: Dg Chest 2 View  09/11/2012  *RADIOLOGY REPORT*  Clinical Data: Cough for 3 weeks  CHEST - 2 VIEW  Comparison: 08/31/2012  Findings: Heart size is normal.  No pleural effusion or edema identified.  No airspace consolidation noted.  Review of the visualized osseous structures  is unremarkable.  Mild spondylosis identified within the thoracic spine.  IMPRESSION:  1.  No acute cardiopulmonary abnormalities. 2.  Thoracic spondylosis noted.   Original Report Authenticated By: Signa Kell, M.D.     EKG: Independently reviewed. Sinus rhythm with LBBB.  Assessment/Plan Principal Problem:   SOB (shortness of breath) Active Problems:   Essential hypertension, benign   Hyperlipidemia   1. Shortness of breath most likely from bronchitis - continue with nebulizer and steroids and Pulmicort along with antibiotics. Check CT chest as patient easily gets hypoxic. 2. Hypertension - continue home medications. 3. Hyperlipidemia - continue home  medications. 4. Chronic LBBB. 5. Hyperglycemia probably from steroids - check hemoglobin A1c.    Code Status: Full code.  Family Communication: Patient's wife at the bedside.  Disposition Plan: Admit to inpatient.    Jaymin Waln N. [facility] Pager [phone].  If 7PM-7AM, please contact night-coverage [email or web address] Password [facility] 09/11/2012, 10:39 PM

## 2012-09-11 NOTE — Progress Notes (Signed)
  Pt admitted to the unit. Pt is stable, alert and oriented per baseline. Oriented to room, staff, and call bell. Educated to call for any assistance. Bed in lowest position, call bell within reach- will continue to monitor. 

## 2012-09-12 ENCOUNTER — Inpatient Hospital Stay (HOSPITAL_COMMUNITY): Payer: BC Managed Care – PPO

## 2012-09-12 DIAGNOSIS — K219 Gastro-esophageal reflux disease without esophagitis: Secondary | ICD-10-CM

## 2012-09-12 DIAGNOSIS — R7309 Other abnormal glucose: Secondary | ICD-10-CM

## 2012-09-12 DIAGNOSIS — J209 Acute bronchitis, unspecified: Principal | ICD-10-CM

## 2012-09-12 LAB — BASIC METABOLIC PANEL
Calcium: 9.2 mg/dL (ref 8.4–10.5)
Chloride: 100 mEq/L (ref 96–112)
Creatinine, Ser: 0.99 mg/dL (ref 0.50–1.35)
GFR calc Af Amer: >90 mL/min (ref >90)

## 2012-09-12 LAB — CBC
HCT: 39.8 % (ref 39.0–52.0)
Platelets: 216 10*3/uL (ref 150–400)
RDW: 13.4 % (ref 11.5–15.5)
WBC: 12.5 10*3/uL — ABNORMAL HIGH (ref 4.0–10.5)

## 2012-09-12 LAB — HEMOGLOBIN A1C
Hgb A1c MFr Bld: 5.9 % — ABNORMAL HIGH (ref <5.7)
Mean Plasma Glucose: 123 mg/dL — ABNORMAL HIGH (ref <117)

## 2012-09-12 MED ORDER — IOHEXOL 350 MG/ML SOLN
100.0000 mL | Freq: Once | INTRAVENOUS | Status: AC | PRN
  Administered 2012-09-12: 100 mL via INTRAVENOUS

## 2012-09-12 MED ORDER — ALBUTEROL SULFATE (5 MG/ML) 0.5% IN NEBU
2.5000 mg | INHALATION_SOLUTION | Freq: Three times a day (TID) | RESPIRATORY_TRACT | Status: DC
  Administered 2012-09-13: 2.5 mg via RESPIRATORY_TRACT
  Filled 2012-09-12: qty 0.5

## 2012-09-12 MED ORDER — DEXTROMETHORPHAN POLISTIREX 30 MG/5ML PO LQCR
30.0000 mg | Freq: Two times a day (BID) | ORAL | Status: DC
  Administered 2012-09-12 – 2012-09-13 (×3): 30 mg via ORAL
  Filled 2012-09-12 (×6): qty 5

## 2012-09-12 MED ORDER — ZOLPIDEM TARTRATE 5 MG PO TABS: 10.0000 mg | ORAL_TABLET | Freq: Every evening | ORAL | Status: DC | PRN

## 2012-09-12 MED ORDER — GUAIFENESIN ER 600 MG PO TB12
600.0000 mg | ORAL_TABLET | Freq: Two times a day (BID) | ORAL | Status: DC
  Administered 2012-09-12 – 2012-09-13 (×3): 600 mg via ORAL
  Filled 2012-09-12 (×4): qty 1

## 2012-09-12 MED ORDER — HYDROCOD POLST-CHLORPHEN POLST 10-8 MG/5ML PO LQCR: 5.0000 mL | Freq: Two times a day (BID) | ORAL | Status: DC | PRN

## 2012-09-12 MED ORDER — HYDROCOD POLST-CHLORPHEN POLST 10-8 MG/5ML PO LQCR
5.0000 mL | Freq: Two times a day (BID) | ORAL | Status: DC
  Administered 2012-09-12 – 2012-09-13 (×3): 5 mL via ORAL
  Filled 2012-09-12 (×3): qty 5

## 2012-09-12 NOTE — Progress Notes (Signed)
Addendum  Patient seen and examined, chart and data base reviewed.  I agree with the above assessment and plan.  For full details please see Mrs. Algis Downs PA note.  Negative CT angio, continue antibiotics, steroids, bronchodilators and antibiotics.   Clint Lipps, MD [facility] Pager: [phone] 09/12/2012, 1:35 PM

## 2012-09-12 NOTE — Progress Notes (Signed)
[facility] PROGRESS NOTE  Kevin Aguirre WUJ:[id number] DOB: 1951-02-13 DOA: 09/11/2012 PCP: Sanda Linger, MD  Assessment/Plan:  Acute Bronchitis 3 weeks in duration (Started on his way home from working in [place]) CT Angio negative. Continue IV steroids, albuterol neb, levoquin Mucolytics, cough suppressant (non-productive cough is severe) On oxygen as patient easily becomes hypoxic  Hypertension Controlled on home medications  Chronic LBBB D/c tele  Gerd Protonix   DVT Prophylaxis:  Lovenox  Code Status: Full Family Communication:  Disposition Plan: inpatient.  Hopefully home tomorrow if improved.    Antibiotics:  Levaquin started on admission  HPI/Subjective: Cough is really causing pain and insomnia  Objective: Filed Vitals:   09/11/12 2300 09/11/12 2337 09/12/12 0448 09/12/12 0815  BP:  154/91 116/73   Pulse:  96 92   Temp:  98.4 F (36.9 C) 98.2 F (36.8 C)   TempSrc:  Axillary Oral   Resp:  18 16   Height: 5\' 11"  (1.803 m)     Weight: 90.9 kg (200 lb 6.4 oz)     SpO2: 94% 94% 94% 95%    Intake/Output Summary (Last 24 hours) at 09/12/12 1321 Last data filed at 09/12/12 1100  Gross per 24 hour  Intake      0 ml  Output   1400 ml  Net  -1400 ml   Filed Weights   09/11/12 2300  Weight: 90.9 kg (200 lb 6.4 oz)    Exam:   General:  A&O, NAD, Appears worn out.  Cardiovascular: RRR, no murmurs, rubs or gallops, no lower extremity edema  Respiratory: CTA, no wheeze, crackles, or rales.  No increased work of breathing. Non-productive paroxysmal cough.  Abdomen: Soft, non-tender, non-distended, + bowel sounds, no masses  Musculoskeletal: Able to move all 4 extremities, 5/5 strength in each  Data Reviewed: Basic Metabolic Panel:  Recent Labs Lab 09/11/12 1324 09/11/12 1605 09/12/12 0545  NA 137 134* 137  K 4.3 4.1 3.9  CL 100 97 100  CO2 28 28 27   GLUCOSE 114* 189* 119*  BUN 12 12 11   CREATININE 1.0 0.86 0.99  CALCIUM  8.7 9.1 9.2   Liver Function Tests:  Recent Labs Lab 09/11/12 1605  AST 31  ALT 34  ALKPHOS 102  BILITOT 0.7  PROT 7.4  ALBUMIN 4.0   CBC:  Recent Labs Lab 09/11/12 1324 09/11/12 1605 09/12/12 0545  WBC 11.2* 11.0* 12.5*  NEUTROABS  --  8.8*  --   HGB 15.5 15.3 14.4  HCT 45.5 42.3 39.8  MCV 95.9 92.6 91.9  PLT 246.0 215 216   BNP (last 3 results)  Recent Labs  09/11/12 1605  PROBNP 59.3     Studies: Dg Chest 2 View  09/11/2012  *RADIOLOGY REPORT*  Clinical Data: Cough for 3 weeks  CHEST - 2 VIEW  Comparison: 08/31/2012  Findings: Heart size is normal.  No pleural effusion or edema identified.  No airspace consolidation noted.  Review of the visualized osseous structures is unremarkable.  Mild spondylosis identified within the thoracic spine.  IMPRESSION:  1.  No acute cardiopulmonary abnormalities. 2.  Thoracic spondylosis noted.   Original Report Authenticated By: Signa Kell, M.D.    Ct Angio Chest Pe W/cm &/or Wo Cm  09/12/2012  *RADIOLOGY REPORT*  Clinical Data: Shortness of breath, chest pain  CT ANGIOGRAPHY CHEST  Technique:  Multidetector CT imaging of the chest using the standard protocol during bolus administration of intravenous contrast. Multiplanar reconstructed images including MIPs were obtained and reviewed  to evaluate the vascular anatomy.  Contrast: OMNIPAQUE IOHEXOL 350 MG/ML SOLN  Comparison: 09/11/2012 radiograph, 08/17/2011 cardiac c t a  Findings: The pulmonary arterial branches are patent.  Minimal ascending ectasia, otherwise normal caliber aorta.  Heart size within normal limits.  No pleural or pericardial effusion.  No enlarged intrathoracic lymph nodes.  Limited images through the upper abdomen show hepatic steatosis.  Central airways are patent.  No pneumothorax.  Mild peripheral reticular opacities/scarring.  No confluent airspace opacity.  No new suspicious nodularity.  Multilevel degenerative changes.  No acute osseous finding.   IMPRESSION: No pulmonary embolism or acute intrathoracic process.   Original Report Authenticated By: Jearld Lesch, M.D.     Scheduled Meds: . albuterol  2.5 mg Nebulization Q6H  . aspirin EC  81 mg Oral QHS  . atorvastatin  10 mg Oral Daily  . budesonide (PULMICORT) nebulizer solution  0.25 mg Nebulization BID  . chlorpheniramine-HYDROcodone  5 mL Oral Q12H  . dextromethorphan  30 mg Oral BID  . enoxaparin (LOVENOX) injection  40 mg Subcutaneous Q24H  . fluticasone  1 spray Each Nare Daily  . guaiFENesin  600 mg Oral BID  . irbesartan  150 mg Oral Daily  . levofloxacin (LEVAQUIN) IV  500 mg Intravenous Q24H  . loratadine  10 mg Oral Daily  . methylPREDNISolone (SOLU-MEDROL) injection  40 mg Intravenous Daily  . pantoprazole  40 mg Oral QAC breakfast  . sodium chloride  3 mL Intravenous Q12H  . sodium chloride  3 mL Intravenous Q12H   Continuous Infusions:   Principal Problem:   SOB (shortness of breath) Active Problems:   Essential hypertension, benign   Hyperlipidemia    Conley Canal  [facility] Pager [phone]. If 7PM-7AM, please contact night-coverage at [email or web address], password [facility] 09/12/2012, 1:21 PM  LOS: 1 day

## 2012-09-12 NOTE — Progress Notes (Signed)
Pt ambulated independently on room air. Ambulated 150 ft. O2 was between 93% and 89%. Put pt on O2 at 2L when in room and O2 went to 98%. Peter Congo RN

## 2012-09-13 MED ORDER — HYDROCOD POLST-CHLORPHEN POLST 10-8 MG/5ML PO LQCR: 5.0000 mL | Freq: Two times a day (BID) | ORAL | Status: DC

## 2012-09-13 MED ORDER — ALBUTEROL SULFATE HFA 108 (90 BASE) MCG/ACT IN AERS: 2.0000 | INHALATION_SPRAY | Freq: Four times a day (QID) | RESPIRATORY_TRACT | Status: DC | PRN

## 2012-09-13 MED ORDER — DEXTROMETHORPHAN POLISTIREX 30 MG/5ML PO LQCR: 30.0000 mg | Freq: Two times a day (BID) | ORAL | Status: DC

## 2012-09-13 MED ORDER — BUDESONIDE-FORMOTEROL FUMARATE 160-4.5 MCG/ACT IN AERO: 2.0000 | INHALATION_SPRAY | Freq: Two times a day (BID) | RESPIRATORY_TRACT | Status: DC

## 2012-09-13 MED ORDER — BUDESONIDE-FORMOTEROL FUMARATE 160-4.5 MCG/ACT IN AERO
2.0000 | INHALATION_SPRAY | Freq: Two times a day (BID) | RESPIRATORY_TRACT | Status: DC
  Filled 2012-09-13: qty 6

## 2012-09-13 MED ORDER — GUAIFENESIN ER 600 MG PO TB12: 600.0000 mg | ORAL_TABLET | Freq: Two times a day (BID) | ORAL | Status: DC

## 2012-09-13 NOTE — Progress Notes (Signed)
NURSING PROGRESS NOTE  Kevin Aguirre [id number] Discharge Data: 09/13/2012 11:05 AM Attending Provider: Maretta Bees, MD LKG:[id number] Yetta Barre, MD     Devra Dopp to be D/C'd Home per MD order.  Discussed with the patient the After Visit Summary and all questions fully answered. All IV's discontinued with no bleeding noted. All belongings returned to patient for patient to take home.   Last Vital Signs:  Blood pressure 130/84, pulse 86, temperature 98.1 F (36.7 C), temperature source Oral, resp. rate 16, height 5\' 11"  (1.803 m), weight 90.9 kg (200 lb 6.4 oz), SpO2 98.00%.  Discharge Medication List   Medication List    STOP taking these medications       HYDROcodone-homatropine 5-1.5 MG/5ML syrup  Commonly known as:  HYCODAN      TAKE these medications       albuterol 108 (90 BASE) MCG/ACT inhaler  Commonly known as:  PROVENTIL HFA;VENTOLIN HFA  Inhale 2 puffs into the lungs every 6 (six) hours as needed. For allergies.     aspirin EC 81 MG tablet  Take 81 mg by mouth at bedtime.     atorvastatin 10 MG tablet  Commonly known as:  LIPITOR  Take 1 tablet (10 mg total) by mouth daily.     budesonide-formoterol 160-4.5 MCG/ACT inhaler  Commonly known as:  SYMBICORT  Inhale 2 puffs into the lungs 2 (two) times daily.     Hydrocod Polst-Chlorphen Polst 10-8 MG Cp12  Take 1 tablet by mouth 2 (two) times daily as needed.     chlorpheniramine-HYDROcodone 10-8 MG/5ML Lqcr  Commonly known as:  TUSSIONEX  Take 5 mLs by mouth every 12 (twelve) hours.     dextromethorphan 30 MG/5ML liquid  Commonly known as:  DELSYM  Take 5 mLs (30 mg total) by mouth 2 (two) times daily.     esomeprazole 40 MG capsule  Commonly known as:  NEXIUM  Take 1 capsule (40 mg total) by mouth daily before breakfast. ID# [id number]     guaiFENesin 600 MG 12 hr tablet  Commonly known as:  MUCINEX  Take 1 tablet (600 mg total) by mouth 2 (two) times daily.     levofloxacin 500 MG tablet   Commonly known as:  LEVAQUIN  Take 1 tablet (500 mg total) by mouth daily.     mometasone 50 MCG/ACT nasal spray  Commonly known as:  NASONEX  Place 2 sprays into the nose as needed. Use before flying.     olmesartan 20 MG tablet  Commonly known as:  BENICAR  Take 1 tablet (20 mg total) by mouth daily.     predniSONE 10 MG tablet  Commonly known as:  DELTASONE  Take 3 tablets on days 1-3, take 2 tablets on days 4-6, take 1 tablet on days 7-9     simethicone 80 MG chewable tablet  Commonly known as:  MYLICON  Chew 80 mg by mouth every 6 (six) hours as needed. For gas.     zolpidem 10 MG tablet  Commonly known as:  AMBIEN  Take 1 tablet (10 mg total) by mouth at bedtime as needed. For sleep.     ZYRTEC ALLERGY 10 MG tablet  Generic drug:  cetirizine  Take 10 mg by mouth daily.          Madelin Rear RN, MSN, [employer]

## 2012-09-13 NOTE — Discharge Summary (Signed)
Physician Discharge Summary  Kevin Aguirre ZOX:[id number] DOB: 01-Apr-1951 DOA: 09/11/2012  PCP: Sanda Linger, MD  Admit date: 09/11/2012 Discharge date: 09/13/2012  Time spent: 50 minutes  Recommendations for Outpatient Follow-up:  1. Follow up with primary care physician for on-going medical maintenance and chronic bronchitis management  Discharge Diagnoses:  Principal Problem:   Acute bronchitis Active Problems:   Essential hypertension, benign   SOB (shortness of breath)   Hyperlipidemia   Discharge Condition: No longer needing oxygen.  Stable.  Still with cough  Diet recommendation: regular diet  Filed Weights   09/11/12 2300  Weight: 90.9 kg (200 lb 6.4 oz)    History of present illness at the time of admission:  Kevin Aguirre is a 62 y.o.male who has just recently returned from [place] after being retired from his regular job presents with complaints of shortness of breath. Patient has been having cough for last 2 weeks and went to his PCP last week on Wednesday (5 days ago).  He was prescribed doxycycline. Despite the Doxy he continued having cough and developed a rash on his abdomen 2 days ago. Today patient went to his PCP again -  along with his cough he also developed worsening shortness of breath with wheezing. Denies any chest pain or any fever and chills. Patient was given Levaquin. But due to worsening shortness of breath patient presented to the ER and was found to be wheezing. Patient easily gets hypoxic and desaturates. Patient was placed on steroids and nebulizer and antibiotics and has been admitted for shortness of breath which is most likely from bronchitis.   Hospital Course:  Acute Bronchitis  3 weeks in duration (Started on his way home from working in [place])  CT Angio was negative for PE.  He was treated with IV steroids, nebulizers, IV levaquin, and oxygen via nasal canula He also received supportive treatment with mucolytics, cough suppressant  (non-productive cough is severe), and IV fluids. This morning he was able to ambulate with out oxygen and maintain appropriate saturations.  He will be instructed to complete his previously prescribed medications from his PCP (prednisone and Levaquin).  He was prescribed symbicort for maintenance, and an albuterol rescue inhaler - as well as  tussionex to calm his cough and sleep.  Chronic LBBB  Initially monitored on telemetry for 24 hours.  Stable, no issues.  Gerd  Continue PPI  Hypertension Controlled.  Continue Benicar.     Discharge Exam: Filed Vitals:   09/12/12 0815 09/12/12 1452 09/12/12 2043 09/13/12 0500  BP:  148/79 126/78 130/84  Pulse:  110 109 86  Temp:  98 F (36.7 C) 98 F (36.7 C) 98.1 F (36.7 C)  TempSrc:  Oral Oral Oral  Resp:  20 20 16   Height:      Weight:      SpO2: 95% 93% 92% 98%    General: A&O, NAD, Appears improved. Eating breakfast. Cardiovascular: RRR, no murmurs, rubs or gallops, no lower extremity edema  Respiratory: CTA, no wheeze, crackles, or rales. No increased work of breathing. Cough decreased.  Abdomen: Soft, non-tender, non-distended, + bowel sounds, no masses  Musculoskeletal: Able to move all 4 extremities, 5/5 strength in each   Discharge Instructions      Discharge Orders   Future Appointments Provider Department Dept Phone   10/31/2012 8:30 AM Etta Grandchild, MD [facility] [phone]   Future Orders Complete By Expires     Diet - low sodium heart healthy  As directed     Increase activity slowly  As directed         Medication List    STOP taking these medications       HYDROcodone-homatropine 5-1.5 MG/5ML syrup  Commonly known as:  HYCODAN      TAKE these medications       albuterol 108 (90 BASE) MCG/ACT inhaler  Commonly known as:  PROVENTIL HFA;VENTOLIN HFA  Inhale 2 puffs into the lungs every 6 (six) hours as needed. For allergies.     aspirin EC 81 MG tablet  Take 81 mg by  mouth at bedtime.     atorvastatin 10 MG tablet  Commonly known as:  LIPITOR  Take 1 tablet (10 mg total) by mouth daily.     budesonide-formoterol 160-4.5 MCG/ACT inhaler  Commonly known as:  SYMBICORT  Inhale 2 puffs into the lungs 2 (two) times daily.     Hydrocod Polst-Chlorphen Polst 10-8 MG Cp12  Take 1 tablet by mouth 2 (two) times daily as needed.     chlorpheniramine-HYDROcodone 10-8 MG/5ML Lqcr  Commonly known as:  TUSSIONEX  Take 5 mLs by mouth every 12 (twelve) hours.     dextromethorphan 30 MG/5ML liquid  Commonly known as:  DELSYM  Take 5 mLs (30 mg total) by mouth 2 (two) times daily.     esomeprazole 40 MG capsule  Commonly known as:  NEXIUM  Take 1 capsule (40 mg total) by mouth daily before breakfast. ID# [id number]     guaiFENesin 600 MG 12 hr tablet  Commonly known as:  MUCINEX  Take 1 tablet (600 mg total) by mouth 2 (two) times daily.     levofloxacin 500 MG tablet  Commonly known as:  LEVAQUIN  Take 1 tablet (500 mg total) by mouth daily.     mometasone 50 MCG/ACT nasal spray  Commonly known as:  NASONEX  Place 2 sprays into the nose as needed. Use before flying.     olmesartan 20 MG tablet  Commonly known as:  BENICAR  Take 1 tablet (20 mg total) by mouth daily.     predniSONE 10 MG tablet  Commonly known as:  DELTASONE  Take 3 tablets on days 1-3, take 2 tablets on days 4-6, take 1 tablet on days 7-9     simethicone 80 MG chewable tablet  Commonly known as:  MYLICON  Chew 80 mg by mouth every 6 (six) hours as needed. For gas.     zolpidem 10 MG tablet  Commonly known as:  AMBIEN  Take 1 tablet (10 mg total) by mouth at bedtime as needed. For sleep.     ZYRTEC ALLERGY 10 MG tablet  Generic drug:  cetirizine  Take 10 mg by mouth daily.       Follow-up Information   Follow up with Sanda Linger, MD. Schedule an appointment as soon as possible for a visit in 2 weeks.   Contact information:   [address] [place]  [place] [address] [phone]        The results of significant diagnostics from this hospitalization (including imaging, microbiology, ancillary and laboratory) are listed below for reference.    Significant Diagnostic Studies: Dg Chest 2 View  09/11/2012  *RADIOLOGY REPORT*  Clinical Data: Cough for 3 weeks  CHEST - 2 VIEW  Comparison: 08/31/2012  Findings: Heart size is normal.  No pleural effusion or edema identified.  No airspace consolidation noted.  Review of the  visualized osseous structures is unremarkable.  Mild spondylosis identified within the thoracic spine.  IMPRESSION:  1.  No acute cardiopulmonary abnormalities. 2.  Thoracic spondylosis noted.   Original Report Authenticated By: Signa Kell, M.D.    Dg Chest 2 View  08/31/2012  *RADIOLOGY REPORT*  Clinical Data: Cough  CHEST - 2 VIEW  Comparison: 08/16/2011  Findings: Normal heart size.   Clear lungs.  No pleural effusion. No pneumothorax.  IMPRESSION: No active cardiopulmonary disease.   Original Report Authenticated By: Jolaine Click, M.D.    Ct Angio Chest Pe W/cm &/or Wo Cm  09/12/2012  *RADIOLOGY REPORT*  Clinical Data: Shortness of breath, chest pain  CT ANGIOGRAPHY CHEST  Technique:  Multidetector CT imaging of the chest using the standard protocol during bolus administration of intravenous contrast. Multiplanar reconstructed images including MIPs were obtained and reviewed to evaluate the vascular anatomy.  Contrast: OMNIPAQUE IOHEXOL 350 MG/ML SOLN  Comparison: 09/11/2012 radiograph, 08/17/2011 cardiac c t a  Findings: The pulmonary arterial branches are patent.  Minimal ascending ectasia, otherwise normal caliber aorta.  Heart size within normal limits.  No pleural or pericardial effusion.  No enlarged intrathoracic lymph nodes.  Limited images through the upper abdomen show hepatic steatosis.  Central airways are patent.  No pneumothorax.  Mild peripheral reticular opacities/scarring.  No confluent airspace  opacity.  No new suspicious nodularity.  Multilevel degenerative changes.  No acute osseous finding.  IMPRESSION: No pulmonary embolism or acute intrathoracic process.   Original Report Authenticated By: Jearld Lesch, M.D.     Labs: Basic Metabolic Panel:  Recent Labs Lab 09/11/12 1324 09/11/12 1605 09/12/12 0545  NA 137 134* 137  K 4.3 4.1 3.9  CL 100 97 100  CO2 28 28 27   GLUCOSE 114* 189* 119*  BUN 12 12 11   CREATININE 1.0 0.86 0.99  CALCIUM 8.7 9.1 9.2   Liver Function Tests:  Recent Labs Lab 09/11/12 1605  AST 31  ALT 34  ALKPHOS 102  BILITOT 0.7  PROT 7.4  ALBUMIN 4.0   CBC:  Recent Labs Lab 09/11/12 1324 09/11/12 1605 09/12/12 0545  WBC 11.2* 11.0* 12.5*  NEUTROABS  --  8.8*  --   HGB 15.5 15.3 14.4  HCT 45.5 42.3 39.8  MCV 95.9 92.6 91.9  PLT 246.0 215 216   BNP: BNP (last 3 results)  Recent Labs  09/11/12 1605  PROBNP 59.3     Signed:  [place], Marianne L, PA-C [facility] 09/13/2012, 1:56 PM  Attending Patient seen and examined, agree with the assessment and plan. Much better clinically, lungs clear. Need to place, on maintenance inhaler.  S Chyna Kneece

## 2012-09-13 NOTE — Care Management Note (Signed)
    Page 1 of 1   09/13/2012     3:04:20 PM   CARE MANAGEMENT NOTE 09/13/2012  Patient:  Kevin Aguirre, Kevin Aguirre   Account Number:  [id number]  Date Initiated:  09/13/2012  Documentation initiated by:  Letha Cape  Subjective/Objective Assessment:   dx acute bronchitis  admit- lives with spouse. pta indep.     Action/Plan:   Anticipated DC Date:  09/13/2012   Anticipated DC Plan:  HOME/SELF CARE      DC Planning Services  CM consult      Choice offered to / List presented to:             Status of service:  Completed, signed off Medicare Important Message given?   (If response is "NO", the following Medicare IM given date fields will be blank) Date Medicare IM given:   Date Additional Medicare IM given:    Discharge Disposition:  HOME/SELF CARE  Per UR Regulation:  Reviewed for med. necessity/level of care/duration of stay  If discussed at Long Length of Stay Meetings, dates discussed:    Comments:  09/13/12 15:03 Letha Cape RN, BSN [phone] patient lives with spouse, pta indep.  Patient for dc today.

## 2012-09-13 NOTE — Progress Notes (Signed)
Utilization review completed.  

## 2012-09-18 ENCOUNTER — Telehealth: Payer: Self-pay

## 2012-09-18 ENCOUNTER — Telehealth: Payer: Self-pay | Admitting: Internal Medicine

## 2012-09-18 NOTE — Telephone Encounter (Signed)
[place]  States he is going to stop taking Benicar due to increase in heart rate, (advised should wait to discuss with MD at appt scheduled for 5/1 at 0900.

## 2012-09-18 NOTE — Telephone Encounter (Signed)
Pt has an appt with Dr. Yetta Barre 09/21/2012.

## 2012-09-18 NOTE — Telephone Encounter (Signed)
Pt called [facility] states that he was inpatient last week for bronchitis. He says that PCP changed his BP medication would like to know if dose should be changes due to HR of 100.  Per protocal, pt need appt to discuss medication change, also need for hospital follow up. Transferred to scheduler to set up.

## 2012-09-19 NOTE — Telephone Encounter (Signed)
benicar should not raise the heart rate

## 2012-09-20 NOTE — Telephone Encounter (Signed)
Pt informed of MD's advisement. Pt states that since he has stopped taking Benicar his HR has dropped substantially.

## 2012-09-21 ENCOUNTER — Ambulatory Visit (INDEPENDENT_AMBULATORY_CARE_PROVIDER_SITE_OTHER): Payer: BC Managed Care – PPO | Admitting: Internal Medicine

## 2012-09-21 ENCOUNTER — Encounter: Payer: Self-pay | Admitting: Internal Medicine

## 2012-09-21 VITALS — BP 118/72 | HR 83 | Temp 97.3°F | Resp 16 | Wt 194.5 lb

## 2012-09-21 DIAGNOSIS — I10 Essential (primary) hypertension: Secondary | ICD-10-CM

## 2012-09-21 DIAGNOSIS — R059 Cough, unspecified: Secondary | ICD-10-CM

## 2012-09-21 DIAGNOSIS — R05 Cough: Secondary | ICD-10-CM

## 2012-09-21 NOTE — Assessment & Plan Note (Signed)
He feels like Benicar has caused an increase in his heart rate so he stopped it His BP is well controlled today

## 2012-09-21 NOTE — Assessment & Plan Note (Signed)
It appears that he has had a flare of asthma or a severe uri with protracted cough He is improved on symbicort and cough suppressants I have asked him to see pulmonary for further evaluation

## 2012-09-21 NOTE — Progress Notes (Signed)
Subjective:    Patient ID: Kevin Aguirre, male    DOB: 09-24-1950, 62 y.o.   MRN: [id number]  Cough This is a recurrent problem. The current episode started 1 to 4 weeks ago. The problem has been gradually improving. The problem occurs every few hours. The cough is productive of sputum. Pertinent negatives include no chest pain, chills, ear congestion, ear pain, fever, headaches, heartburn, hemoptysis, myalgias, nasal congestion, postnasal drip, rash, rhinorrhea, sore throat, shortness of breath, sweats, weight loss or wheezing. Nothing aggravates the symptoms. He has tried steroid inhaler, a beta-agonist inhaler, oral steroids, prescription cough suppressant and OTC cough suppressant for the symptoms. The treatment provided moderate relief. His past medical history is significant for asthma. There is no history of bronchiectasis, bronchitis, COPD, emphysema, environmental allergies or pneumonia.      Review of Systems  Constitutional: Negative for fever, chills, weight loss, diaphoresis, activity change, appetite change, fatigue and unexpected weight change.  HENT: Negative.  Negative for ear pain, sore throat, rhinorrhea, trouble swallowing, voice change and postnasal drip.   Eyes: Negative.   Respiratory: Positive for cough. Negative for apnea, hemoptysis, choking, chest tightness, shortness of breath and wheezing.   Cardiovascular: Negative.  Negative for chest pain, palpitations and leg swelling.  Gastrointestinal: Negative.  Negative for heartburn, nausea, vomiting, abdominal pain, diarrhea, constipation and blood in stool.  Endocrine: Negative.   Genitourinary: Negative.   Musculoskeletal: Negative.  Negative for myalgias.  Skin: Negative.  Negative for rash.  Allergic/Immunologic: Negative.  Negative for environmental allergies.  Neurological: Negative.  Negative for dizziness, tremors, weakness, light-headedness and headaches.  Hematological: Negative.  Negative for adenopathy.  Does not bruise/bleed easily.  Psychiatric/Behavioral: Negative.        Objective:   Physical Exam  Vitals reviewed. Constitutional: He is oriented to person, place, and time. He appears well-developed and well-nourished. No distress.  HENT:  Head: Normocephalic and atraumatic.  Mouth/Throat: Oropharynx is clear and moist. No oropharyngeal exudate.  Eyes: Conjunctivae are normal. Right eye exhibits no discharge. Left eye exhibits no discharge. No scleral icterus.  Neck: Normal range of motion. Neck supple. No JVD present. No tracheal deviation present. No thyromegaly present.  Cardiovascular: Normal rate, regular rhythm, normal heart sounds and intact distal pulses.  Exam reveals no gallop and no friction rub.   No murmur heard. Pulmonary/Chest: Effort normal and breath sounds normal. No stridor. No respiratory distress. He has no wheezes. He has no rales. He exhibits no tenderness.  Abdominal: Soft. Bowel sounds are normal. He exhibits no distension and no mass. There is no tenderness. There is no rebound and no guarding.  Musculoskeletal: Normal range of motion. He exhibits no edema and no tenderness.  Lymphadenopathy:    He has no cervical adenopathy.  Neurological: He is oriented to person, place, and time.  Skin: Skin is warm and dry. No rash noted. He is not diaphoretic. No erythema. No pallor.  Psychiatric: He has a normal mood and affect. His behavior is normal. Judgment and thought content normal.      Lab Results  Component Value Date   WBC 12.5* 09/12/2012   HGB 14.4 09/12/2012   HCT 39.8 09/12/2012   PLT 216 09/12/2012   GLUCOSE 119* 09/12/2012   CHOL 159 05/15/2012   TRIG 140.0 05/15/2012   HDL 37.00* 05/15/2012   LDLCALC 94 05/15/2012   ALT 34 09/11/2012   AST 31 09/11/2012   NA 137 09/12/2012   K 3.9 09/12/2012   CL 100 09/12/2012  CREATININE 0.99 09/12/2012   BUN 11 09/12/2012   CO2 27 09/12/2012   TSH 1.37 05/15/2012   PSA 1.10 05/15/2012   HGBA1C 5.9* 09/12/2012      Dg Chest 2 View  09/11/2012  *RADIOLOGY REPORT*  Clinical Data: Cough for 3 weeks  CHEST - 2 VIEW  Comparison: 08/31/2012  Findings: Heart size is normal.  No pleural effusion or edema identified.  No airspace consolidation noted.  Review of the visualized osseous structures is unremarkable.  Mild spondylosis identified within the thoracic spine.  IMPRESSION:  1.  No acute cardiopulmonary abnormalities. 2.  Thoracic spondylosis noted.   Original Report Authenticated By: Signa Kell, M.D.    Dg Chest 2 View  08/31/2012  *RADIOLOGY REPORT*  Clinical Data: Cough  CHEST - 2 VIEW  Comparison: 08/16/2011  Findings: Normal heart size.   Clear lungs.  No pleural effusion. No pneumothorax.  IMPRESSION: No active cardiopulmonary disease.   Original Report Authenticated By: Jolaine Click, M.D.    Ct Angio Chest Pe W/cm &/or Wo Cm  09/12/2012  *RADIOLOGY REPORT*  Clinical Data: Shortness of breath, chest pain  CT ANGIOGRAPHY CHEST  Technique:  Multidetector CT imaging of the chest using the standard protocol during bolus administration of intravenous contrast. Multiplanar reconstructed images including MIPs were obtained and reviewed to evaluate the vascular anatomy.  Contrast: OMNIPAQUE IOHEXOL 350 MG/ML SOLN  Comparison: 09/11/2012 radiograph, 08/17/2011 cardiac c t a  Findings: The pulmonary arterial branches are patent.  Minimal ascending ectasia, otherwise normal caliber aorta.  Heart size within normal limits.  No pleural or pericardial effusion.  No enlarged intrathoracic lymph nodes.  Limited images through the upper abdomen show hepatic steatosis.  Central airways are patent.  No pneumothorax.  Mild peripheral reticular opacities/scarring.  No confluent airspace opacity.  No new suspicious nodularity.  Multilevel degenerative changes.  No acute osseous finding.  IMPRESSION: No pulmonary embolism or acute intrathoracic process.   Original Report Authenticated By: Jearld Lesch, M.D.   Assessment &  Plan:

## 2012-09-21 NOTE — Patient Instructions (Signed)

## 2012-09-22 ENCOUNTER — Ambulatory Visit: Payer: BC Managed Care – PPO | Admitting: Internal Medicine

## 2012-10-04 ENCOUNTER — Ambulatory Visit (INDEPENDENT_AMBULATORY_CARE_PROVIDER_SITE_OTHER): Payer: BC Managed Care – PPO | Admitting: Internal Medicine

## 2012-10-04 ENCOUNTER — Encounter: Payer: Self-pay | Admitting: Internal Medicine

## 2012-10-04 VITALS — BP 142/88 | HR 73 | Ht 71.0 in | Wt 196.0 lb

## 2012-10-04 DIAGNOSIS — R059 Cough, unspecified: Secondary | ICD-10-CM

## 2012-10-04 DIAGNOSIS — R05 Cough: Secondary | ICD-10-CM

## 2012-10-04 NOTE — Patient Instructions (Addendum)
Cough is from sinus drainage, possible acid reflux, possible asthma, and voice fatigue t All of this is working together to cause cyclical cough/LPR cough 100% compliance with the below advice is important  #Sinus drainage  - start 3% hypertonic nasal saline spray (OTC) at night 2 squirts - continue Zyrtec - - start nasal steroid generic fluticasone inhaler 2 squirts each nostril daily as advised    #Possible Acid Reflux  - Continue proton pump inhibitor  - At all times avoid colas, spices, cheeses, spirits, red meats, beer, chocolates, fried foods etc.,   - sleep with head end of bed elevated  - eat small frequent meals  - do not go to bed for 3 hours after last meal  #Possible Asthma  - stop Symbicort for 2-3 weeks - Have full pulmonary function test in 2-3 weeks after stopping Symbicort   #Cyclical cough/Irritable Larynx  - please choose 2-3 days and observe complete voice rest - no talking or whispering  - at all times there  there is urge to cough, drink water or swallow or sip on throat lozenge   #Followup - Have pulmonary function test in 2 weeks. Have the technician fax me the results I will review it and call you back to decide the next step 

## 2012-10-04 NOTE — Progress Notes (Signed)
Subjective:    Patient ID: Kevin Aguirre, male    DOB: March 16, 1951, 62 y.o.   MRN: [id number] PCP Sanda Linger, MD    HPI  Body mass index is 27.35 kg/(m^2).   reports that he quit smoking about 10 years ago. His smoking use included Cigarettes. He has a 13.5 pack-year smoking history. He has never used smokeless tobacco.   IOV 10/04/2012    62 year old male.   AT baseline: have chronic spring allergies. Some seven years ago was given albuterol prn x 7 years. It was given to him [facility] [place] [place] by a primary care on basis of "Allergies causing issue with breathing". He was using this albuterol 4-5 times a year for some wheeze. The wheeze would happen during spring time. These past 7 years thrugh 08/21/12 was living in [place] and found that albuterol use was only as above. Upon return to [place] end March 2014 has noticed increased use of albuterol in [place] . Also increased symptoms during 1 year sabbatical 2012-2013 in [place]. At baseline chronic cough: he has been having chronic cough that also corresponded to allergies/breathing and being in [place].   After current permanent return to [place], enlisted PMD Sanda Linger, MD and ace ihibitor stopped but after that cough got worse. Concern was infectious. Hospitalized for "Acute bronchitis" 08/23/12 through 09/14/12. At discharge he was placed on symbicort on scheduled basis on an empiric basis at his request because he did not want to use albuterol prn which he was doing for years prior to admission (though intake was rare as above)  Since discharge he is better. Feels symbicort is helping somewhat but still feels he has chronic cough with clering of throat. PCP Sanda Linger, MD felt he needs a pulmponary evaluation. Currently cough is mild. Quality is dry. Can be day or night. There is associated post nasal drainage, clearing of throat, ticke in throat, but no gagging. Cough made worse by spring, and [place]. Cough relieved by living in [place]   Cough relevant  hx  #BP  - was on ace inhibitor but stopped in March/April 2014  #Sinus    Known to have aspring allergies. Never seen allergist. Takes zyrtec daily. Allergies worse in [place] than in [place]. Is not taking any nasal spray buit uses it only before flying  #GERD  - does have gerd. Controlled with PPI  #Pulmnary  - clinically suspectd to have asthma in spring 7 years ago  #JOb  - retired 2014. 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 Had to talk a lot  #Exposure - smoker. qiuit 1994.  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    CT chest 09/12/12 - CT ANGIOGRAPHY CHEST  Technique: Multidetector CT imaging of the chest using the  standard protocol during bolus administration of intravenous  contrast. Multiplanar reconstructed images including MIPs were  obtained and reviewed to evaluate the vascular anatomy.  Contrast: OMNIPAQUE IOHEXOL 350 MG/ML SOLN  Comparison: 09/11/2012 radiograph, 08/17/2011 cardiac c t a  Findings: The pulmonary arterial branches are patent.  Minimal ascending ectasia, otherwise normal caliber aorta.  Heart size within normal limits. No pleural or pericardial  effusion.  No enlarged intrathoracic lymph nodes.  Limited images through the upper abdomen show hepatic steatosis.  Central airways are patent. No pneumothorax. Mild peripheral  reticular opacities/scarring. No confluent airspace opacity. No  new suspicious nodularity.  Multilevel degenerative changes. No acute osseous finding.  IMPRESSION:  No pulmonary embolism or acute intrathoracic process.  Original Report Authenticated By: Jearld Lesch, M.D.   Cleda Daub 10/04/2012 - normal    Past Medical History  Diagnosis Date  . Hypertension   . Hyperlipidemia   . GERD (gastroesophageal reflux disease)      Family History  Problem Relation Age of Onset  . Arthritis Mother   . Hyperlipidemia Mother   . Hypertension Mother   . Heart disease Neg Hx    . Cancer Neg Hx   . Early death Neg Hx   . Alcohol abuse Neg Hx   . Diabetes Neg Hx   . Kidney disease Neg Hx   . Stroke Neg Hx      History   Social History  . Marital Status: Married    Spouse Name: N/A    Number of Children: N/A  . Years of Education: N/A   Occupational History  . Not on file.   Social History Main Topics  . Smoking status: Former Smoker -- 0.75 packs/day for 18 years    Types: Cigarettes    Quit date: 05/24/2002  . Smokeless tobacco: Never Used  . Alcohol Use: 1.8 oz/week    3 Cans of beer per week     Comment: socially  . Drug Use: No  . Sexually Active: Yes   Other Topics Concern  . Not on file   Social History Narrative  . No narrative on file     Allergies  Allergen Reactions  . Lisinopril     cough  . Erythromycin Hives and Itching  . Doxycycline Rash     Outpatient Prescriptions Prior to Visit  Medication Sig Dispense Refill  . aspirin EC 81 MG tablet Take 81 mg by mouth at bedtime.      Marland Kitchen atorvastatin (LIPITOR) 10 MG tablet Take 1 tablet (10 mg total) by mouth daily.  90 tablet  3  . budesonide-formoterol (SYMBICORT) 160-4.5 MCG/ACT inhaler Inhale 2 puffs into the lungs 2 (two) times daily.  1 Inhaler  3  . cetirizine (ZYRTEC ALLERGY) 10 MG tablet Take 10 mg by mouth daily.      Marland Kitchen esomeprazole (NEXIUM) 40 MG capsule Take 1 capsule (40 mg total) by mouth daily before breakfast. ID# [id number]  90 capsule  3  . mometasone (NASONEX) 50 MCG/ACT nasal spray Place 2 sprays into the nose as needed. Use before flying.  51 g  3  . simethicone (MYLICON) 80 MG chewable tablet Chew 80 mg by mouth every 6 (six) hours as needed. For gas.      . zolpidem (AMBIEN) 10 MG tablet Take 1 tablet (10 mg total) by mouth at bedtime as needed. For sleep.  90 tablet  1  . dextromethorphan (DELSYM) 30 MG/5ML liquid Take 5 mLs (30 mg total) by mouth 2 (two) times daily.  89 mL    . albuterol (PROVENTIL HFA;VENTOLIN HFA) 108 (90 BASE) MCG/ACT inhaler Inhale  2 puffs into the lungs every 6 (six) hours as needed. For allergies.  1 Inhaler  3  . guaiFENesin (MUCINEX) 600 MG 12 hr tablet Take 1 tablet (600 mg total) by mouth 2 (two) times daily.       No facility-administered medications prior to visit.      Review of Systems  Constitutional: Negative for fever and unexpected weight change.  HENT: Negative for ear pain, nosebleeds, congestion, sore throat, rhinorrhea, sneezing, trouble swallowing, dental problem, postnasal drip and sinus pressure.   Eyes: Negative for  redness and itching.  Respiratory: Positive for cough (Non-productive). Negative for chest tightness, shortness of breath and wheezing.   Cardiovascular: Positive for palpitations. Negative for leg swelling.  Gastrointestinal: Negative for nausea and vomiting.  Genitourinary: Negative for dysuria.  Musculoskeletal: Negative for joint swelling.  Skin: Negative for rash.  Neurological: Negative for headaches.  Hematological: Does not bruise/bleed easily.  Psychiatric/Behavioral: Negative for dysphoric mood. The patient is not nervous/anxious.        Objective:   Physical Exam  Nursing note and vitals reviewed. Constitutional: He is oriented to person, place, and time. He appears well-developed and well-nourished. No distress.  Clears throat occ  HENT:  Head: Normocephalic and atraumatic.  Right Ear: External ear normal.  Left Ear: External ear normal.  Mouth/Throat: Oropharynx is clear and moist. No oropharyngeal exudate.  Eyes: Conjunctivae and EOM are normal. Pupils are equal, round, and reactive to light. Right eye exhibits no discharge. Left eye exhibits no discharge. No scleral icterus.  Neck: Normal range of motion. Neck supple. No JVD present. No tracheal deviation present. No thyromegaly present.  Cardiovascular: Normal rate, regular rhythm and intact distal pulses.  Exam reveals no gallop and no friction rub.   No murmur heard. Pulmonary/Chest: Effort normal and  breath sounds normal. No respiratory distress. He has no wheezes. He has no rales. He exhibits no tenderness.  Abdominal: Soft. Bowel sounds are normal. He exhibits no distension and no mass. There is no tenderness. There is no rebound and no guarding.  Musculoskeletal: Normal range of motion. He exhibits no edema and no tenderness.  Lymphadenopathy:    He has no cervical adenopathy.  Neurological: He is alert and oriented to person, place, and time. He has normal reflexes. No cranial nerve deficit. Coordination normal.  Skin: Skin is warm and dry. No rash noted. He is not diaphoretic. No erythema. No pallor.  Psychiatric: He has a normal mood and affect. His behavior is normal. Judgment and thought content normal.          Assessment & Plan:

## 2012-10-09 ENCOUNTER — Telehealth: Payer: Self-pay | Admitting: Internal Medicine

## 2012-10-09 ENCOUNTER — Ambulatory Visit (HOSPITAL_COMMUNITY)
Admission: RE | Admit: 2012-10-09 | Discharge: 2012-10-09 | Disposition: A | Discharge status: Home / Self Care | Payer: BC Managed Care – PPO | Source: Ambulatory Visit | Attending: Internal Medicine | Admitting: Internal Medicine

## 2012-10-09 DIAGNOSIS — R05 Cough: Secondary | ICD-10-CM

## 2012-10-09 DIAGNOSIS — R059 Cough, unspecified: Secondary | ICD-10-CM | POA: Insufficient documentation

## 2012-10-09 LAB — PULMONARY FUNCTION TEST

## 2012-10-09 MED ORDER — PREDNISONE 10 MG PO TABS: ORAL_TABLET | ORAL | Status: DC

## 2012-10-09 MED ORDER — ALBUTEROL SULFATE (5 MG/ML) 0.5% IN NEBU
2.5000 mg | INHALATION_SOLUTION | Freq: Once | RESPIRATORY_TRACT | Status: AC
  Administered 2012-10-09: 2.5 mg via RESPIRATORY_TRACT

## 2012-10-09 NOTE — Telephone Encounter (Signed)
i called [facility] and they can have pt done today at 2:00. I called pt back and let him know of MR recs. He stated he was fine with having the PFT done today at 2:00. Pt aware of where to go. Nothing further was needed

## 2012-10-09 NOTE — Telephone Encounter (Signed)
I spoke with pt. He was in on 10/04/12 and told to stop the symbicort. Pt stated on Saturday he was out working in the yard and since he has been coughing all day/night. He is bringing up white-yellow phlem and feels sticky. Denies any wheezing, no chest tx. Just coughing. Pt is wanting to know if he should restart the symbicort. Please advise MR thanks  Allergies  Allergen Reactions  . Lisinopril     cough  . Erythromycin Hives and Itching  . Doxycycline Rash

## 2012-10-09 NOTE — Telephone Encounter (Signed)
Spoke with pt and notified of recs per MR Pt verbalized understanding Rx was sent to pharm  Pt scheduled with MR 11/15/12

## 2012-10-09 NOTE — Telephone Encounter (Signed)
pft shows 10/09/2012  - fev1 2.3L/62%, R 58 with 16% bd response. TLC 90%. DLCO 76  - also significant VCD on flow volume loop. RT indicated difficulty with test  Triage  Please tell patient that pft shows asthma plus the vocal cord irrittation I spoke about So,  - Take prednisone 40 mg daily x 2 days, then 20mg  daily x 2 days, then 10mg  daily x 2 days, then 5mg  daily x 2 days and stop - restart symbicort prior mdi 2 puff twice daily - ensure refills  - continue sinus advised I gave at last OV  - follow GERD advice  - see me in 2-3 weeks in office with cough score and bedside spirometry so I can decide on vocal cord irritation Rx I spoke to him about   Dr. Kalman Shan, M.D., [facility].C.P Pulmonary and Critical Care Medicine Staff Physician [facility] Pager: [phone], If no answer or between  15:00h - 7:00h: call [phone]  10/09/2012 4:04 PM

## 2012-10-09 NOTE — Addendum Note (Signed)
Addended by: Christen Butter on: 10/09/2012 04:51 PM   Modules accepted: Orders

## 2012-10-09 NOTE — Telephone Encounter (Signed)
See if you can get [facility] into [facility] today for full PFT with BD. I am in [address] [facility] and they can give it to me once done. Prefer done by 3pm today. IF full PFT not possible, atleast do spirometry with BD  Dr. Kalman Shan, M.D., [facility].C.P Pulmonary and Critical Care Medicine Staff Physician [facility] Pager: [phone], If no answer or between  15:00h - 7:00h: call [phone]  10/09/2012 10:10 AM

## 2012-10-10 DIAGNOSIS — R05 Cough: Secondary | ICD-10-CM | POA: Insufficient documentation

## 2012-10-10 NOTE — Assessment & Plan Note (Signed)
Cough is from sinus drainage, possible acid reflux, possible asthma, and voice fatigue t All of this is working together to cause cyclical cough/LPR cough 100% compliance with the below advice is important  #Sinus drainage  - start 3% hypertonic nasal saline spray (OTC) at night 2 squirts - continue Zyrtec - - start nasal steroid generic fluticasone inhaler 2 squirts each nostril daily as advised    #Possible Acid Reflux  - Continue proton pump inhibitor  - At all times avoid colas, spices, cheeses, spirits, red meats, beer, chocolates, fried foods etc.,   - sleep with head end of bed elevated  - eat small frequent meals  - do not go to bed for 3 hours after last meal  #Possible Asthma  - stop Symbicort for 2-3 weeks - Have full pulmonary function test in 2-3 weeks after stopping Symbicort   #Cyclical cough/Irritable Larynx  - please choose 2-3 days and observe complete voice rest - no talking or whispering  - at all times there  there is urge to cough, drink water or swallow or sip on throat lozenge   #Followup - Have pulmonary function test in 2 weeks. Have the technician fax me the results I will review it and call you back to decide the next step

## 2012-10-19 ENCOUNTER — Encounter (HOSPITAL_COMMUNITY): Payer: BC Managed Care – PPO

## 2012-10-31 ENCOUNTER — Ambulatory Visit (INDEPENDENT_AMBULATORY_CARE_PROVIDER_SITE_OTHER): Payer: BC Managed Care – PPO | Admitting: Internal Medicine

## 2012-10-31 ENCOUNTER — Encounter: Payer: Self-pay | Admitting: Internal Medicine

## 2012-10-31 VITALS — BP 132/76 | HR 80 | Temp 97.6°F | Resp 16 | Wt 196.1 lb

## 2012-10-31 DIAGNOSIS — J4530 Mild persistent asthma, uncomplicated: Secondary | ICD-10-CM

## 2012-10-31 DIAGNOSIS — J45909 Unspecified asthma, uncomplicated: Secondary | ICD-10-CM

## 2012-10-31 DIAGNOSIS — J309 Allergic rhinitis, unspecified: Secondary | ICD-10-CM

## 2012-10-31 DIAGNOSIS — I10 Essential (primary) hypertension: Secondary | ICD-10-CM

## 2012-10-31 MED ORDER — MOMETASONE FUROATE 50 MCG/ACT NA SUSP: 2.0000 | NASAL | Status: DC | PRN

## 2012-10-31 MED ORDER — OLMESARTAN MEDOXOMIL 20 MG PO TABS: 20.0000 mg | ORAL_TABLET | Freq: Every day | ORAL | Status: DC

## 2012-10-31 MED ORDER — BUDESONIDE-FORMOTEROL FUMARATE 160-4.5 MCG/ACT IN AERO: 2.0000 | INHALATION_SPRAY | Freq: Two times a day (BID) | RESPIRATORY_TRACT | Status: DC

## 2012-10-31 NOTE — Assessment & Plan Note (Signed)
Well controlled on symbicort. 

## 2012-10-31 NOTE — Assessment & Plan Note (Signed)
His BP is well controlled 

## 2012-10-31 NOTE — Progress Notes (Signed)
  Subjective:    Patient ID: Kevin Aguirre, male    DOB: April 26, 1951, 62 y.o.   MRN: [id number]  Hypertension This is a chronic problem. The current episode started more than 1 year ago. The problem is controlled. Pertinent negatives include no anxiety, blurred vision, chest pain, headaches, malaise/fatigue, neck pain, orthopnea, palpitations, peripheral edema, PND, shortness of breath or sweats. There are no associated agents to hypertension. Past treatments include angiotensin blockers. The current treatment provides moderate improvement. Compliance problems include exercise and diet.       Review of Systems  Constitutional: Negative.  Negative for fever, chills, malaise/fatigue, diaphoresis and fatigue.  HENT: Negative.  Negative for neck pain.   Eyes: Negative.  Negative for blurred vision.  Respiratory: Negative.  Negative for cough, choking, chest tightness, shortness of breath, wheezing and stridor.   Cardiovascular: Negative.  Negative for chest pain, palpitations, orthopnea, leg swelling and PND.  Gastrointestinal: Negative.   Endocrine: Negative.   Genitourinary: Negative.   Musculoskeletal: Negative.   Skin: Negative.   Allergic/Immunologic: Negative.   Neurological: Negative.  Negative for dizziness, weakness and headaches.  Hematological: Negative.   Psychiatric/Behavioral: Negative.        Objective:   Physical Exam  Vitals reviewed. Constitutional: He is oriented to person, place, and time. He appears well-developed and well-nourished. No distress.  HENT:  Head: Normocephalic and atraumatic.  Mouth/Throat: Oropharynx is clear and moist. No oropharyngeal exudate.  Eyes: Conjunctivae are normal. Right eye exhibits no discharge. Left eye exhibits no discharge. No scleral icterus.  Neck: Normal range of motion. Neck supple. No JVD present. No tracheal deviation present. No thyromegaly present.  Cardiovascular: Normal rate, regular rhythm, normal heart sounds and  intact distal pulses.  Exam reveals no gallop and no friction rub.   No murmur heard. Pulmonary/Chest: Effort normal and breath sounds normal. No stridor. No respiratory distress. He has no wheezes. He has no rales. He exhibits no tenderness.  Abdominal: Soft. Bowel sounds are normal. He exhibits no distension and no mass. There is no tenderness. There is no rebound and no guarding.  Musculoskeletal: Normal range of motion. He exhibits no edema and no tenderness.  Lymphadenopathy:    He has no cervical adenopathy.  Neurological: He is oriented to person, place, and time.  Skin: Skin is warm and dry. No rash noted. He is not diaphoretic. No erythema. No pallor.  Psychiatric: He has a normal mood and affect. His behavior is normal. Judgment and thought content normal.     Lab Results  Component Value Date   WBC 12.5* 09/12/2012   HGB 14.4 09/12/2012   HCT 39.8 09/12/2012   PLT 216 09/12/2012   GLUCOSE 119* 09/12/2012   CHOL 159 05/15/2012   TRIG 140.0 05/15/2012   HDL 37.00* 05/15/2012   LDLCALC 94 05/15/2012   ALT 34 09/11/2012   AST 31 09/11/2012   NA 137 09/12/2012   K 3.9 09/12/2012   CL 100 09/12/2012   CREATININE 0.99 09/12/2012   BUN 11 09/12/2012   CO2 27 09/12/2012   TSH 1.37 05/15/2012   PSA 1.10 05/15/2012   HGBA1C 5.9* 09/12/2012       Assessment & Plan:

## 2012-10-31 NOTE — Patient Instructions (Signed)

## 2012-11-08 ENCOUNTER — Telehealth: Payer: Self-pay | Admitting: Internal Medicine

## 2012-11-08 NOTE — Telephone Encounter (Signed)
Try mucinex dm 1200 mg bid and ok to use rob with codeine as a back up until ov

## 2012-11-08 NOTE — Telephone Encounter (Signed)
Pt states that he as appt next Wed 6/25 Pt c/o increased cough and would like to move appt to sooner date.  Pt does not want cough to progress into a bigger problem. Pt currently taking inhalers as rxd--Pt took robitussin w/codeine OTC last night and was able to get some relief No appts available today for patient to come in and be seen.   Pt scheduled to see MW tomorrow at 215p Is there anything the pt can take to relieve symptoms?  Please advise MW for Dr Marchelle Gearing is not in office today. Thanks.

## 2012-11-08 NOTE — Telephone Encounter (Signed)
Pt aware of recs. He voiced his understanding and needed nothing further

## 2012-11-09 ENCOUNTER — Ambulatory Visit (INDEPENDENT_AMBULATORY_CARE_PROVIDER_SITE_OTHER): Payer: BC Managed Care – PPO | Admitting: Internal Medicine

## 2012-11-09 ENCOUNTER — Encounter: Payer: Self-pay | Admitting: Internal Medicine

## 2012-11-09 VITALS — BP 132/80 | HR 58 | Temp 98.3°F | Ht 72.0 in | Wt 201.0 lb

## 2012-11-09 DIAGNOSIS — R05 Cough: Secondary | ICD-10-CM

## 2012-11-09 MED ORDER — TRAMADOL HCL 50 MG PO TABS: ORAL_TABLET | ORAL | Status: DC

## 2012-11-09 MED ORDER — PREDNISONE (PAK) 10 MG PO TABS: ORAL_TABLET | ORAL | Status: DC

## 2012-11-09 MED ORDER — MONTELUKAST SODIUM 10 MG PO TABS: 10.0000 mg | ORAL_TABLET | Freq: Every day | ORAL | Status: DC

## 2012-11-09 NOTE — Patient Instructions (Addendum)
Prednisone 10 mg take  4 each am x 2 days,   2 each am x 2 days,  1 each am x 2 days and stop   Stop zyrtec and take chlortrimeton 4 mg with Pepcid 20 mg one at bedtime - you can also use chlortrimeton 4 mg every 6 hours for drainage but may cause drowsiness  Stay off symbiocrt and start singulair 10 mg one at bedtime  GERD (REFLUX)  is an extremely common cause of respiratory symptoms, many times with no significant heartburn at all.    It can be treated with medication, but also with lifestyle changes including avoidance of late meals, excessive alcohol, smoking cessation, and avoid fatty foods, chocolate, peppermint, colas, red wine, and acidic juices such as orange juice.  NO MINT OR MENTHOL PRODUCTS SO NO COUGH DROPS  USE SUGARLESS CANDY INSTEAD (jolley ranchers or Stover's)  NO OIL BASED VITAMINS - use powdered substitutes.   For daytime cough take delsym when you can't afford to be sleepy from the codeine but the goal is no cough or throat clearing   Reschedule Ramaswamy for 2 weeks

## 2012-11-09 NOTE — Progress Notes (Signed)
Subjective:    Patient ID: Kevin Aguirre, male    DOB: 1950/08/02, 62 y.o.   MRN: [id number] PCP Sanda Linger, MD    Brief patient profile:  62 year old male yowm quit smoking 2003 with chronic cough developed while on ACEi stopped around 08/31/12    Body mass index is 27.35 kg/(m^2).     IOV 10/04/2012 AT baseline: have chronic spring allergies. Some seven years ago was given albuterol prn x 7 years. It was given to him [facility] [place] [place] by a primary care on basis of "Allergies causing issue with breathing". He was using this albuterol 4-5 times a year for some wheeze. The wheeze would happen during spring time. These past 7 years thrugh 08/21/12 was living in [place] and found that albuterol use was only as above. Upon return to [place] end March 2014 has noticed increased use of albuterol in [place] . Also increased symptoms during 1 year sabbatical 2012-2013 in [place]. At baseline chronic cough: he has been having chronic cough that also corresponded to allergies/breathing and being in [place].   After current permanent return to [place], enlisted PMD Sanda Linger, MD and ace ihibitor stopped but after that cough got worse. Concern was infectious. Hospitalized for "Acute bronchitis" 08/23/12 through 09/14/12. At discharge he was placed on symbicort on scheduled basis on an empiric basis at his request because he did not want to use albuterol prn which he was doing for years prior to admission (though intake was rare as above)  Since discharge he is better. Feels symbicort is helping somewhat but still feels he has chronic cough with clering of throat. PCP Sanda Linger, MD felt he needs a pulmponary evaluation. Currently cough is mild. Quality is dry. Can be day or night. There is associated post nasal drainage, clearing of throat, ticke in throat, but no gagging. Cough made worse by spring, and [place]. Cough relieved by living in [place]   Cough relevant hx  #BP  - was on ace inhibitor but stopped in March/April  2014  #Sinus    Known to have spring allergies. Never seen allergist. Takes zyrtec daily. Allergies worse in [place] than in [place]. Is not taking any nasal spray buit uses it only before flying  #GERD  - does have gerd. Controlled with PPI  #Pulmnary  - clinically suspected to have asthma in spring 7 years ago  #JOb  - retired 2014. 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 Had to talk a lot  #Exposure - smoker. Quit 1994.  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 Cough is from sinus drainage, possible acid reflux, possible asthma, and voice fatigue t All of this is working together to cause cyclical cough/LPR cough 100% compliance with the below advice is important  #Sinus drainage  - start 3% hypertonic nasal saline spray (OTC) at night 2 squirts - continue Zyrtec - - start nasal steroid generic fluticasone inhaler 2 squirts each nostril daily as advised    #Possible Acid Reflux  - Continue proton pump inhibitor  - At all times avoid colas, spices, cheeses, spirits, red meats, beer, chocolates, fried foods etc.,   - sleep with head end of bed elevated  - eat small frequent meals  - do not go to bed for 3 hours after last meal  #Possible Asthma  - stop Symbicort for 2-3 weeks - Have full pulmonary function test in 2-3 weeks after stopping Symbicort   #  Cyclical cough/Irritable Larynx  - please choose 2-3 days and observe complete voice rest - no talking or whispering  - at all times there  there is urge to cough, drink water or swallow or sip on throat lozenge    11/09/2012 f/u ov/[place] [facility]  Chief Complaint  Patient presents with  . Acute Visit    Pt c/o increased cough for the past wk- prod with white to yellow sputum. He states cough comes and goes.     After several weeks off symbicort the cough started > resumed it 3 weeks prior to OV  Seemed to help some while on also prednisone (still had drainage down throat and throat clearing  even on prednisone) then when finished pred cough started back 6/14 despite symbicort despite avoiding outdoor exposure.  Cough is min productive /worst in am after stirring and after supper   No obvious pattern to day to day  variabilty or assoc excess or purulent mucus or cp or chest tightness, subjective wheeze overt sinus or hb symptoms. No unusual exp hx or h/o childhood pna/ asthma or knowledge of premature birth.   Sleeping ok without nocturnal  or early am exacerbation  of respiratory  c/o's or need for noct saba. Also denies any obvious fluctuation of symptoms with weather or environmental changes or other aggravating or alleviating factors except as outlined above  Current Medications were reviewed in [facility] electronic medical record.  ROS  The following are not active complaints unless bolded sore throat, dysphagia, dental problems, itching, sneezing,  nasal congestion or excess/ purulent secretions, ear ache,   fever, chills, sweats, unintended wt loss, pleuritic or exertional cp, hemoptysis,  orthopnea pnd or leg swelling, presyncope, palpitations, heartburn, abdominal pain, anorexia, nausea, vomiting, diarrhea  or change in bowel or urinary habits, change in stools or urine, dysuria,hematuria,  rash, arthralgias, visual complaints, headache, numbness weakness or ataxia or problems with walking or coordination,  change in mood/affect or memory.       CT chest 09/12/12 - CT ANGIOGRAPHY CHEST  Technique: Multidetector CT imaging of the chest using the  standard protocol during bolus administration of intravenous  contrast. Multiplanar reconstructed images including MIPs were  obtained and reviewed to evaluate the vascular anatomy.  Contrast: OMNIPAQUE IOHEXOL 350 MG/ML SOLN  Comparison: 09/11/2012 radiograph, 08/17/2011 cardiac c t a  Findings: The pulmonary arterial branches are patent.  Minimal ascending ectasia, otherwise normal caliber aorta.  Heart size within  normal limits. No pleural or pericardial  effusion.  No enlarged intrathoracic lymph nodes.  Limited images through the upper abdomen show hepatic steatosis.  Central airways are patent. No pneumothorax. Mild peripheral  reticular opacities/scarring. No confluent airspace opacity. No  new suspicious nodularity.  Multilevel degenerative changes. No acute osseous finding.  IMPRESSION:  No pulmonary embolism or acute intrathoracic process.  Original Report Authenticated By: Jearld Lesch, M.D.   Cleda Daub 10/04/2012 - normal    Past Medical History  Diagnosis Date  . Hypertension   . Hyperlipidemia   . GERD (gastroesophageal reflux disease)      Family History  Problem Relation Age of Onset  . Arthritis Mother   . Hyperlipidemia Mother   . Hypertension Mother   . Heart disease Neg Hx   . Cancer Neg Hx   . Early death Neg Hx   . Alcohol abuse Neg Hx   . Diabetes Neg Hx   . Kidney disease Neg Hx   . Stroke Neg Hx  History   Social History  . Marital Status: Married    Spouse Name: N/A    Number of Children: N/A  . Years of Education: N/A   Occupational History  . Not on file.   Social History Main Topics  . Smoking status: Former Smoker -- 0.75 packs/day for 18 years    Types: Cigarettes    Quit date: 05/24/2002  . Smokeless tobacco: Never Used  . Alcohol Use: 1.8 oz/week    3 Cans of beer per week     Comment: socially  . Drug Use: No  . Sexually Active: Yes   Other Topics Concern  . Not on file   Social History Narrative  . No narrative on file     Allergies  Allergen Reactions  . Lisinopril     cough  . Erythromycin Hives and Itching  . Doxycycline Rash       Objective:   Physical Exam  Nursing note and vitals reviewed. Constitutional: He is oriented to person, place, and time. He appears well-developed and well-nourished. No distress.  Clears throat occ  HENT:  Head: Normocephalic and atraumatic.  Right Ear: External ear normal.  Left  Ear: External ear normal.  Mouth/Throat: Oropharynx is clear and moist. No oropharyngeal exudate.  Eyes: Conjunctivae and EOM are normal. Pupils are equal, round, and reactive to light. Right eye exhibits no discharge. Left eye exhibits no discharge. No scleral icterus.  Neck: Normal range of motion. Neck supple. No JVD present. No tracheal deviation present. No thyromegaly present.  Cardiovascular: Normal rate, regular rhythm and intact distal pulses.  Exam reveals no gallop and no friction rub.   No murmur heard. Pulmonary/Chest: Effort normal and breath sounds normal. No respiratory distress. He has no wheezes. He has no rales. He exhibits no tenderness.  Abdominal: Soft. Bowel sounds are normal. He exhibits no distension and no mass. There is no tenderness. There is no rebound and no guarding.  Musculoskeletal: Normal range of motion. He exhibits no edema and no tenderness.  Lymphadenopathy:    He has no cervical adenopathy.  Neurological: He is alert and oriented to person, place, and time. He has normal reflexes. No cranial nerve deficit. Coordination normal.  Skin: Skin is warm and dry. No rash noted. He is not diaphoretic. No erythema. No pallor.  Psychiatric: He has a normal mood and affect. His behavior is normal. Judgment and thought content normal.          Assessment & Plan:

## 2012-11-11 DIAGNOSIS — R059 Cough, unspecified: Secondary | ICD-10-CM | POA: Insufficient documentation

## 2012-11-11 DIAGNOSIS — R05 Cough: Secondary | ICD-10-CM | POA: Insufficient documentation

## 2012-11-11 NOTE — Assessment & Plan Note (Addendum)
Lack of cough resolution could mean an alternative diagnosis (not due to asthma, since should have responded to restarting symbicort) , persistence of the disease state (rhinitis or sinusitis) , or inadequacy of currently available therapy (eg no rx available for non-acid gerd) or due to neurogenic cough.  Note that even on prednisone continues to have urge to clear throat c/w  Classic Upper airway cough syndrome, so named because it's frequently impossible to sort out how much is  CR/sinusitis with freq throat clearing (which can be related to primary GERD)   vs  causing  secondary (" extra esophageal")  GERD from wide swings in gastric pressure that occur with throat clearing, often  promoting self use of mint and menthol lozenges that reduce the lower esophageal sphincter tone and exacerbate the problem further in a cyclical fashion.   These are the same pts (now being labeled as having "irritable larynx syndrome" by some cough centers) who not infrequently have a history of having failed to tolerate ace inhibitors,  dry powder inhalers or biphosphonates or report having atypical reflux symptoms that don't respond to standard doses of PPI , and are easily confused as having aecopd or asthma flares by even experienced allergists/ pulmonologists.   For now continue max gerd rx and add h1 1st gen if he can tolerated it. Also trial of singulair since if he does have asthma this is the one drug that doesn't potentially cause a cough and may help rhinitis also.   Finally, needs 3 days min of heavy cough elimination to see if neurogenic cough needs to be addressed or whether this is just cyclical throat clearing.   See instructions for specific recommendations which were reviewed directly with the patient who was given a copy with highlighter outlining the key components.

## 2012-11-15 ENCOUNTER — Ambulatory Visit: Payer: BC Managed Care – PPO | Admitting: Internal Medicine

## 2012-11-28 ENCOUNTER — Encounter: Payer: Self-pay | Admitting: Internal Medicine

## 2012-11-28 ENCOUNTER — Ambulatory Visit (INDEPENDENT_AMBULATORY_CARE_PROVIDER_SITE_OTHER): Payer: BC Managed Care – PPO | Admitting: Internal Medicine

## 2012-11-28 VITALS — BP 126/76 | HR 92 | Temp 98.1°F | Ht 71.0 in | Wt 194.2 lb

## 2012-11-28 DIAGNOSIS — R059 Cough, unspecified: Secondary | ICD-10-CM

## 2012-11-28 DIAGNOSIS — R05 Cough: Secondary | ICD-10-CM

## 2012-11-28 DIAGNOSIS — J387 Other diseases of larynx: Secondary | ICD-10-CM

## 2012-11-28 MED ORDER — GABAPENTIN 300 MG PO CAPS: ORAL_CAPSULE | ORAL | Status: DC

## 2012-11-28 MED ORDER — PREDNISONE 10 MG PO TABS: 10.0000 mg | ORAL_TABLET | Freq: Every day | ORAL | Status: DC

## 2012-11-28 NOTE — Patient Instructions (Addendum)
Cough is from sinus drainage, possible acid reflux, possible asthma, and voice fatigue All of this is working together to cause cyclical cough/LPR cough/irritable larynx 100% compliance with the below advice is important  #Sinus drainage  - continue 3% hypertonic nasal saline spray (OTC) at night 2 squirts - continue chlortrimeton - - continue nasal steroid generic fluticasone inhaler 2 squirts each nostril daily as advised    #Possible Acid Reflux  - Continue proton pump inhibitor  - At all times avoid colas, spices, cheeses, spirits, red meats, beer, chocolates, fried foods etc.,   - sleep with head end of bed elevated  - eat small frequent meals  - do not go to bed for 3 hours after last meal - follow Dr Gretta Cool diet  #Asthma  -restart Symbicort 80/4.5 2puff twice daily - continue singulair - Please take prednisone 40 mg x1 day, then 30 mg x1 day, then 20 mg x1 day, then 10 mg x1 day, and then 5 mg x1 day and stop    #Cyclical cough/Irritable Larynx  - please choose 2-3 days and observe complete voice rest - no talking or whispering  - at all times there  there is urge to cough, drink water or swallow or sip on throat lozenge - refer you to speech therapy Mr Elinor Dodge - Take gabapentin 300mg  once daily x 3 days, then 300mg  twice daily x 3 days, then 300mg  three times daily to continue. If this makes you too sleepy or drowsy call us and we will cut your medication dosing down    #Followup - FU 6 weeks with cough score at followup - spirometry at followupo

## 2012-11-28 NOTE — Progress Notes (Signed)
Subjective:    Patient ID: Kevin Aguirre, male    DOB: 08-30-1950, 62 y.o.   MRN: [id number]  HPI PCP Sanda Linger, MD    Brief patient profile:  62 year old male yowm quit smoking 2003 with chronic cough developed while on ACEi stopped around 08/31/12    Body mass index is 27.35 kg/(m^2).     IOV 10/04/2012 AT baseline: have chronic spring allergies. Some seven years ago was given albuterol prn x 7 years. It was given to him [facility] [place] [place] by a primary care on basis of "Allergies causing issue with breathing". He was using this albuterol 4-5 times a year for some wheeze. The wheeze would happen during spring time. These past 7 years thrugh 08/21/12 was living in [place] and found that albuterol use was only as above. Upon return to [place] end March 2014 has noticed increased use of albuterol in [place] . Also increased symptoms during 1 year sabbatical 2012-2013 in [place]. At baseline chronic cough: he has been having chronic cough that also corresponded to allergies/breathing and being in [place].   After current permanent return to [place], enlisted PMD Sanda Linger, MD and ace ihibitor stopped but after that cough got worse. Concern was infectious. Hospitalized for "Acute bronchitis" 08/23/12 through 09/14/12. At discharge he was placed on symbicort on scheduled basis on an empiric basis at his request because he did not want to use albuterol prn which he was doing for years prior to admission (though intake was rare as above)  Since discharge he is better. Feels symbicort is helping somewhat but still feels he has chronic cough with clering of throat. PCP Sanda Linger, MD felt he needs a pulmponary evaluation. Currently cough is mild. Quality is dry. Can be day or night. There is associated post nasal drainage, clearing of throat, ticke in throat, but no gagging. Cough made worse by spring, and [place]. Cough relieved by living in [place]   Cough relevant hx  #BP  - was on ace inhibitor but stopped in  March/April 2014  #Sinus    Known to have spring allergies. Never seen allergist. Takes zyrtec daily. Allergies worse in [place] than in [place]. Is not taking any nasal spray buit uses it only before flying  #GERD  - does have gerd. Controlled with PPI  #Pulmnary  - clinically suspected to have asthma in spring 7 years ago  #JOb  - retired 2014. 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 Had to talk a lot  #Exposure - smoker. Quit 1994.  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 Cough is from sinus drainage, possible acid reflux, possible asthma, and voice fatigue t All of this is working together to cause cyclical cough/LPR cough 100% compliance with the below advice is important   REC #Sinus drainage  - start 3% hypertonic nasal saline spray (OTC) at night 2 squirts - continue Zyrtec - - start nasal steroid generic fluticasone inhaler 2 squirts each nostril daily as advised    #Possible Acid Reflux  - Continue proton pump inhibitor  - At all times avoid colas, spices, cheeses, spirits, red meats, beer, chocolates, fried foods etc.,   - sleep with head end of bed elevated  - eat small frequent meals  - do not go to bed for 3 hours after last meal  #Possible Asthma  - stop Symbicort for 2-3 weeks - Have full pulmonary function test in 2-3 weeks  after stopping Symbicort   #Cyclical cough/Irritable Larynx  - please choose 2-3 days and observe complete voice rest - no talking or whispering  - at all times there  there is urge to cough, drink water or swallow or sip on throat lozenge  TELEPHOHE CALL 10/09/12 pft shows 10/09/2012  - fev1 2.3L/62%, R 58 with 16% bd response. TLC 90%. DLCO 76  - also significant VCD on flow volume loop. RT indicated difficulty with test   Please tell patient that pft shows asthma plus the vocal cord irrittation I spoke about So,  - Take prednisone 40 mg daily x 2 days, then 20mg  daily x 2 days, then 10mg   daily x 2 days, then 5mg  daily x 2 days and stop - restart symbicort prior mdi 2 puff twice daily - ensure refills  - continue sinus advised I gave at last OV  - follow GERD advice  - see me in 2-3 weeks in office with cough score and bedside spirometry so I can decide on vocal cord irritation Rx I spoke to him about   11/09/2012 f/u ov/[facility]  Chief Complaint  Patient presents with  . Acute Visit    Pt c/o increased cough for the past wk- prod with white to yellow sputum. He states cough comes and goes.     After several weeks off symbicort the cough started > resumed it 3 weeks prior to OV  Seemed to help some while on also prednisone (still had drainage down throat and throat clearing even on prednisone) then when finished pred cough started back 6/14 despite symbicort despite avoiding outdoor exposure.  Cough is min productive /worst in am after stirring and after supper   No obvious pattern to day to day  variabilty or assoc excess or purulent mucus or cp or chest tightness, subjective wheeze overt sinus or hb symptoms. No unusual exp hx or h/o childhood pna/ asthma or knowledge of premature birth.   Sleeping ok without nocturnal  or early am exacerbation  of respiratory  c/o's or need for noct saba. Also denies any obvious fluctuation of symptoms with weather or environmental changes or other aggravating or alleviating factors except as outlined above  Current Medications were reviewed in [facility] electronic medical record.       CT chest 09/12/12 - CT ANGIOGRAPHY CHEST  Technique: Multidetector CT imaging of the chest using the  standard protocol during bolus administration of intravenous  contrast. Multiplanar reconstructed images including MIPs were  obtained and reviewed to evaluate the vascular anatomy.  Contrast: OMNIPAQUE IOHEXOL 350 MG/ML SOLN  Comparison: 09/11/2012 radiograph, 08/17/2011 cardiac c t a  Findings: The pulmonary arterial branches are patent.   Minimal ascending ectasia, otherwise normal caliber aorta.  Heart size within normal limits. No pleural or pericardial  effusion.  No enlarged intrathoracic lymph nodes.  Limited images through the upper abdomen show hepatic steatosis.  Central airways are patent. No pneumothorax. Mild peripheral  reticular opacities/scarring. No confluent airspace opacity. No  new suspicious nodularity.  Multilevel degenerative changes. No acute osseous finding.  IMPRESSION:  No pulmonary embolism or acute intrathoracic process.  Original Report Authenticated By: Jearld Lesch, M.D.   Cleda Daub 10/04/2012 - normal    REC Prednisone 10 mg take  4 each am x 2 days,   2 each am x 2 days,  1 each am x 2 days and stop   Stop zyrtec and take chlortrimeton 4 mg with Pepcid 20 mg one at bedtime -  you can also use chlortrimeton 4 mg every 6 hours for drainage but may cause drowsiness  Stay off symbiocrt and start singulair 10 mg one at bedtime  GERD (REFLUX)  is an extremely common cause of respiratory symptoms, many times with no significant heartburn at all.    It can be treated with medication, but also with lifestyle changes including avoidance of late meals, excessive alcohol, smoking cessation, and avoid fatty foods, chocolate, peppermint, colas, red wine, and acidic juices such as orange juice.  NO MINT OR MENTHOL PRODUCTS SO NO COUGH DROPS  USE SUGARLESS CANDY INSTEAD (jolley ranchers or Stover's)  NO OIL BASED VITAMINS - use powdered substitutes.   For daytime cough take delsym when you can't afford to be sleepy from the codeine but the goal is no cough or throat clearing   Reschedule Kentrel Clevenger for 2 weeks   OV 11/28/2012  Came off symbicort 3 weeks ago per Dr Thurston Hole advice and simulatneously started on singulair. Also zyrtec changed to chlortrimeton. Then 2 weeks started Dr Valetta Mole acid reflyux diet for chronic cough. With this cough seemed to improved bbut past few days cough worse    Dr  Gretta Cool Reflux Symptom Index (> 13-15 suggestive of LPR cough)  11/28/2012   Hoarseness of problem with voice 3  Clearing  Of Throat 4  Excess throat mucus or feeling of post nasal drip 4  Difficulty swallowing food, liquid or tablets 3  Cough after eating or lying down 2  Breathing difficulties or choking episodes 0  Troublesome or annoying cough 4  Sensation of something sticking in throat or lump in throat 3  Heartburn, chest pain, indigestion, or stomach acid coming up 0  TOTAL 23      Kouffman Reflux v Neurogenic Cough Differentiator Reflux 11/28/2012   Do you awaken from a sound sleep coughing violently?                            With trouble breathing? Yes  Do you have choking episodes when you cannot  Get enough air, gasping for air ?              no  Do you usually cough when you lie down into  The bed, or when you just lie down to rest ?                          no  Do you usually cough after meals or eating?         Yes  Do you cough when (or after) you bend over?    no  GERD SCORE  2  Kouffman Reflux v Neurogenic Cough Differentiator Neurogenic  Do you more-or-less cough all day long? yes  Does change of temperature make you cough? n  Does laughing or chuckling cause you to cough? yes  Do fumes (perfume, automobile fumes, burned  Toast, etc.,) cause you to cough ?      n  Does speaking, singing, or talking on the phone cause you to cough   ?               n  Neurogenic/Airway score 2       Review of Systems  Constitutional: Negative for fever and unexpected weight change.  HENT: Negative for ear pain, nosebleeds, congestion, sore throat, rhinorrhea, sneezing, trouble swallowing, dental problem, postnasal drip and sinus pressure.   Eyes:  Negative for redness and itching.  Respiratory: Positive for cough. Negative for chest tightness, shortness of breath and wheezing.   Cardiovascular: Negative for palpitations and leg swelling.  Gastrointestinal: Positive for  nausea and vomiting.  Genitourinary: Negative for dysuria.  Musculoskeletal: Negative for joint swelling.  Skin: Negative for rash.  Neurological: Negative for headaches.  Hematological: Does not bruise/bleed easily.  Psychiatric/Behavioral: Negative for dysphoric mood. The patient is not nervous/anxious.    Current outpatient prescriptions:albuterol (PROVENTIL HFA;VENTOLIN HFA) 108 (90 BASE) MCG/ACT inhaler, Inhale 2 puffs into the lungs every 6 (six) hours as needed. For allergies., Disp: 1 Inhaler, Rfl: 3;  aspirin EC 81 MG tablet, Take 81 mg by mouth at bedtime., Disp: , Rfl: ;  atorvastatin (LIPITOR) 10 MG tablet, Take 1 tablet (10 mg total) by mouth daily., Disp: 90 tablet, Rfl: 3 esomeprazole (NEXIUM) 40 MG capsule, Take 1 capsule (40 mg total) by mouth daily before breakfast. ID# [id number], Disp: 90 capsule, Rfl: 3;  mometasone (NASONEX) 50 MCG/ACT nasal spray, Place 2 sprays into the nose daily., Disp: , Rfl: ;  montelukast (SINGULAIR) 10 MG tablet, Take 1 tablet (10 mg total) by mouth at bedtime., Disp: 30 tablet, Rfl: 11 olmesartan (BENICAR) 20 MG tablet, Take 1 tablet (20 mg total) by mouth daily., Disp: 90 tablet, Rfl: 3;  simethicone (MYLICON) 80 MG chewable tablet, Chew 80 mg by mouth every 6 (six) hours as needed. For gas., Disp: , Rfl: ;  zolpidem (AMBIEN) 10 MG tablet, Take 1 tablet (10 mg total) by mouth at bedtime as needed. For sleep., Disp: 90 tablet, Rfl: 1 traMADol (ULTRAM) 50 MG tablet, 1-2 every 4 hours as needed for cough or pain, Disp: 40 tablet, Rfl: 0      Objective:   Physical Exam Physical Exam  Nursing note and vitals reviewed. Constitutional: He is oriented to person, place, and time. He appears well-developed and well-nourished. No distress.  Clears throat occ  HENT:  Head: Normocephalic and atraumatic.  Right Ear: External ear normal.  Left Ear: External ear normal.  Mouth/Throat: Oropharynx is clear and moist. No oropharyngeal exudate.  Eyes:  Conjunctivae and EOM are normal. Pupils are equal, round, and reactive to light. Right eye exhibits no discharge. Left eye exhibits no discharge. No scleral icterus.  Neck: Normal range of motion. Neck supple. No JVD present. No tracheal deviation present. No thyromegaly present.  Cardiovascular: Normal rate, regular rhythm and intact distal pulses.  Exam reveals no gallop and no friction rub.   No murmur heard. Pulmonary/Chest: Effort normal and breath sounds normal. No respiratory distress WHEEZING + esp RUL and scatterd. He has no rales. He exhibits no tenderness.  Abdominal: Soft. Bowel sounds are normal. He exhibits no distension and no mass. There is no tenderness. There is no rebound and no guarding.  Musculoskeletal: Normal range of motion. He exhibits no edema and no tenderness.  Lymphadenopathy:    He has no cervical adenopathy.  Neurological: He is alert and oriented to person, place, and time. He has normal reflexes. No cranial nerve deficit. Coordination normal.  Skin: Skin is warm and dry. No rash noted. He is not diaphoretic. No erythema. No pallor.  Psychiatric: He has a normal mood and affect. His behavior is normal. Judgment and thought content normal.             Assessment & Plan:

## 2012-11-28 NOTE — Assessment & Plan Note (Signed)
His PFTs got worse in May 2014 when he went off symbicort. He got better when he went back on symbicort in May. Then went off symbicort in June and got worse again in July 11/28/2012 with wheezing. This is enough proof he has asthma.  Of course sinus, GERD and Irritable Larynx/Cyclical cough/VCD playing a role in chronic cough  Therefore,  Cough is from sinus drainage, possible acid reflux, possible asthma, and voice fatigue All of this is working together to cause cyclical cough/LPR cough/irritable larynx 100% compliance with the below advice is important  #Sinus drainage  - continue 3% hypertonic nasal saline spray (OTC) at night 2 squirts - continue chlortrimeton - - continue nasal steroid generic fluticasone inhaler 2 squirts each nostril daily as advised    #Possible Acid Reflux  - Continue proton pump inhibitor  - At all times avoid colas, spices, cheeses, spirits, red meats, beer, chocolates, fried foods etc.,   - sleep with head end of bed elevated  - eat small frequent meals  - do not go to bed for 3 hours after last meal - follow Dr Gretta Cool diet  #Asthma  -restart Symbicort 80/4.5 2puff twice daily - continue singulair - Please take prednisone 40 mg x1 day, then 30 mg x1 day, then 20 mg x1 day, then 10 mg x1 day, and then 5 mg x1 day and stop    #Cyclical cough/Irritable Larynx  - please choose 2-3 days and observe complete voice rest - no talking or whispering  - at all times there  there is urge to cough, drink water or swallow or sip on throat lozenge - refer you to speech therapy Mr Elinor Dodge - Take gabapentin 300mg  once daily x 3 days, then 300mg  twice daily x 3 days, then 300mg  three times daily to continue. If this makes you too sleepy or drowsy call us and we will cut your medication dosing down    #Followup - FU 6 weeks with cough score at followup

## 2012-12-01 ENCOUNTER — Ambulatory Visit: Payer: BC Managed Care – PPO | Admitting: Internal Medicine

## 2012-12-25 ENCOUNTER — Telehealth: Payer: Self-pay | Admitting: Internal Medicine

## 2012-12-25 MED ORDER — MONTELUKAST SODIUM 10 MG PO TABS: 10.0000 mg | ORAL_TABLET | Freq: Every day | ORAL | Status: DC

## 2012-12-25 MED ORDER — GABAPENTIN 300 MG PO CAPS: ORAL_CAPSULE | ORAL | Status: DC

## 2012-12-25 NOTE — Telephone Encounter (Signed)
Cyclical cough/Irritable Larynx  - please choose 2-3 days and observe complete voice rest - no talking or whispering  - at all times there there is urge to cough, drink water or swallow or sip on throat lozenge  - refer you to speech therapy Kevin Aguirre  - Take gabapentin 300mg  once daily x 3 days, then 300mg  twice daily x 3 days, then 300mg  three times daily to continue. If this makes you too sleepy or drowsy call us and we will cut your medication dosing down ---  I spoke with pt and he is down to take gabapentin 300 mg 1 tab TID. He has 2-3 tabs left. He stated his cough is much better and is not coughing much. At times he feels like something is caught in his throat. He is able to yard work now without coughing ETC. He is needing a reifll bc he read that you should not just stop this medication. Since Kevin is scheduled off will forward to doc of the day for recs on if pt can cut back on the gabapentin or still take 1 tab TID. Please advise MW thanks

## 2012-12-25 NOTE — Telephone Encounter (Signed)
I spoke with pt and is aware of recs. Pt appt already scheduled for 01/17/13. RX has been sent and pt aware. Nothing further was needed

## 2012-12-25 NOTE — Telephone Encounter (Signed)
Continue one tid but set up f/u with MR in one month for longterm plan and only given enough to last to ov

## 2013-01-17 ENCOUNTER — Ambulatory Visit (INDEPENDENT_AMBULATORY_CARE_PROVIDER_SITE_OTHER): Payer: BC Managed Care – PPO | Admitting: Internal Medicine

## 2013-01-17 ENCOUNTER — Encounter: Payer: Self-pay | Admitting: Internal Medicine

## 2013-01-17 VITALS — BP 110/80 | HR 60 | Temp 98.3°F | Ht 71.0 in | Wt 196.2 lb

## 2013-01-17 DIAGNOSIS — R05 Cough: Secondary | ICD-10-CM

## 2013-01-17 NOTE — Progress Notes (Signed)
Subjective:    Patient ID: Kevin Aguirre, male    DOB: 1950-05-27, 62 y.o.   MRN: [id number]  HPI PCP Sanda Linger, MD    Brief patient profile:  62 year old male yowm quit smoking 2003 with chronic cough developed while on ACEi stopped around 08/31/12    Body mass index is 27.35 kg/(m^2).     IOV 10/04/2012 AT baseline: have chronic spring allergies. Some seven years ago was given albuterol prn x 7 years. It was given to him [facility] [place] [place] by a primary care on basis of "Allergies causing issue with breathing". He was using this albuterol 4-5 times a year for some wheeze. The wheeze would happen during spring time. These past 7 years thrugh 08/21/12 was living in [place] and found that albuterol use was only as above. Upon return to [place] end March 2014 has noticed increased use of albuterol in [place] . Also increased symptoms during 1 year sabbatical 2012-2013 in [place]. At baseline chronic cough: he has been having chronic cough that also corresponded to allergies/breathing and being in [place].   After current permanent return to [place], enlisted PMD Sanda Linger, MD and ace ihibitor stopped but after that cough got worse. Concern was infectious. Hospitalized for "Acute bronchitis" 08/23/12 through 09/14/12. At discharge he was placed on symbicort on scheduled basis on an empiric basis at his request because he did not want to use albuterol prn which he was doing for years prior to admission (though intake was rare as above)  Since discharge he is better. Feels symbicort is helping somewhat but still feels he has chronic cough with clering of throat. PCP Sanda Linger, MD felt he needs a pulmponary evaluation. Currently cough is mild. Quality is dry. Can be day or night. There is associated post nasal drainage, clearing of throat, ticke in throat, but no gagging. Cough made worse by spring, and [place]. Cough relieved by living in [place]   Cough relevant hx  #BP  - was on ace inhibitor but stopped in  March/April 2014  #Sinus    Known to have spring allergies. Never seen allergist. Takes zyrtec daily. Allergies worse in [place] than in [place]. Is not taking any nasal spray buit uses it only before flying  #GERD  - does have gerd. Controlled with PPI  #Pulmnary  - clinically suspected to have asthma in spring 7 years ago  #JOb  - retired 2014. 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 Had to talk a lot  #Exposure - smoker. Quit 1994.  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 Cough is from sinus drainage, possible acid reflux, possible asthma, and voice fatigue t All of this is working together to cause cyclical cough/LPR cough 100% compliance with the below advice is important   REC #Sinus drainage  - start 3% hypertonic nasal saline spray (OTC) at night 2 squirts - continue Zyrtec - - start nasal steroid generic fluticasone inhaler 2 squirts each nostril daily as advised    #Possible Acid Reflux  - Continue proton pump inhibitor  - At all times avoid colas, spices, cheeses, spirits, red meats, beer, chocolates, fried foods etc.,   - sleep with head end of bed elevated  - eat small frequent meals  - do not go to bed for 3 hours after last meal  #Possible Asthma  - stop Symbicort for 2-3 weeks - Have full pulmonary function test in 2-3 weeks  after stopping Symbicort   #Cyclical cough/Irritable Larynx  - please choose 2-3 days and observe complete voice rest - no talking or whispering  - at all times there  there is urge to cough, drink water or swallow or sip on throat lozenge  TELEPHOHE CALL 10/09/12 pft shows 10/09/2012  - fev1 2.3L/62%, R 58 with 16% bd response. TLC 90%. DLCO 76  - also significant VCD on flow volume loop. RT indicated difficulty with test   Please tell patient that pft shows asthma plus the vocal cord irrittation I spoke about So,  - Take prednisone 40 mg daily x 2 days, then 20mg  daily x 2 days, then 10mg   daily x 2 days, then 5mg  daily x 2 days and stop - restart symbicort prior mdi 2 puff twice daily - ensure refills  - continue sinus advised I gave at last OV  - follow GERD advice  - see me in 2-3 weeks in office with cough score and bedside spirometry so I can decide on vocal cord irritation Rx I spoke to him about   11/09/2012 f/u ov/[facility]  Chief Complaint  Patient presents with  . Acute Visit    Pt c/o increased cough for the past wk- prod with white to yellow sputum. He states cough comes and goes.     After several weeks off symbicort the cough started > resumed it 3 weeks prior to OV  Seemed to help some while on also prednisone (still had drainage down throat and throat clearing even on prednisone) then when finished pred cough started back 6/14 despite symbicort despite avoiding outdoor exposure.  Cough is min productive /worst in am after stirring and after supper   No obvious pattern to day to day  variabilty or assoc excess or purulent mucus or cp or chest tightness, subjective wheeze overt sinus or hb symptoms. No unusual exp hx or h/o childhood pna/ asthma or knowledge of premature birth.   Sleeping ok without nocturnal  or early am exacerbation  of respiratory  c/o's or need for noct saba. Also denies any obvious fluctuation of symptoms with weather or environmental changes or other aggravating or alleviating factors except as outlined above  Current Medications were reviewed in [facility] electronic medical record.       CT chest 09/12/12 - CT ANGIOGRAPHY CHEST  Technique: Multidetector CT imaging of the chest using the  standard protocol during bolus administration of intravenous  contrast. Multiplanar reconstructed images including MIPs were  obtained and reviewed to evaluate the vascular anatomy.  Contrast: OMNIPAQUE IOHEXOL 350 MG/ML SOLN  Comparison: 09/11/2012 radiograph, 08/17/2011 cardiac c t a  Findings: The pulmonary arterial branches are patent.   Minimal ascending ectasia, otherwise normal caliber aorta.  Heart size within normal limits. No pleural or pericardial  effusion.  No enlarged intrathoracic lymph nodes.  Limited images through the upper abdomen show hepatic steatosis.  Central airways are patent. No pneumothorax. Mild peripheral  reticular opacities/scarring. No confluent airspace opacity. No  new suspicious nodularity.  Multilevel degenerative changes. No acute osseous finding.  IMPRESSION:  No pulmonary embolism or acute intrathoracic process.  Original Report Authenticated By: Jearld Lesch, M.D.   Cleda Daub 10/04/2012 - normal    REC Prednisone 10 mg take  4 each am x 2 days,   2 each am x 2 days,  1 each am x 2 days and stop   Stop zyrtec and take chlortrimeton 4 mg with Pepcid 20 mg one at bedtime -  you can also use chlortrimeton 4 mg every 6 hours for drainage but may cause drowsiness  Stay off symbiocrt and start singulair 10 mg one at bedtime  GERD (REFLUX)  is an extremely common cause of respiratory symptoms, many times with no significant heartburn at all.    It can be treated with medication, but also with lifestyle changes including avoidance of late meals, excessive alcohol, smoking cessation, and avoid fatty foods, chocolate, peppermint, colas, red wine, and acidic juices such as orange juice.  NO MINT OR MENTHOL PRODUCTS SO NO COUGH DROPS  USE SUGARLESS CANDY INSTEAD (jolley ranchers or Stover's)  NO OIL BASED VITAMINS - use powdered substitutes.   For daytime cough take delsym when you can't afford to be sleepy from the codeine but the goal is no cough or throat clearing   Reschedule Wanda Cellucci for 2 weeks   OV 11/28/2012  Came off symbicort 3 weeks ago per Dr Thurston Hole advice and simulatneously started on singulair. Also zyrtec changed to chlortrimeton. Then 2 weeks started Dr Valetta Mole acid reflyux diet for chronic cough. With this cough seemed to improved bbut past few days cough  worse  REC   Cough is from sinus drainage, possible acid reflux, possible asthma, and voice fatigue All of this is working together to cause cyclical cough/LPR cough/irritable larynx  100% compliance with the below advice is important   #Sinus drainage  - continue 3% hypertonic nasal saline spray (OTC) at night 2 squirts  - continue chlortrimeton  - - continue nasal steroid generic fluticasone inhaler 2 squirts each nostril daily as advised   #Possible Acid Reflux  - Continue proton pump inhibitor  - At all times avoid colas, spices, cheeses, spirits, red meats, beer, chocolates, fried foods etc.,  - sleep with head end of bed elevated  - eat small frequent meals  - do not go to bed for 3 hours after last meal  - follow Dr Gretta Cool diet   #Asthma  -restart Symbicort 80/4.5 2puff twice daily  - continue singulair  - Please take prednisone 40 mg x1 day, then 30 mg x1 day, then 20 mg x1 day, then 10 mg x1 day, and then 5 mg x1 day and stop   #Cyclical cough/Irritable Larynx  - please choose 2-3 days and observe complete voice rest - no talking or whispering  - at all times there there is urge to cough, drink water or swallow or sip on throat lozenge  - refer you to speech therapy Mr Elinor Dodge  - Take gabapentin 300mg  once daily x 3 days, then 300mg  twice daily x 3 days, then 300mg  three times daily to continue. If this makes you too sleepy or drowsy call us and we will cut your medication dosing down   #Followup  - FU 6 weeks with cough score at followup  - spirometry at followupo   OV 01/17/2013    Followup for chronic multifactorial cough. At the last visit multiple measures were introduced including restarting Symbicort, continuing Singulair, continuing nasal inhalers, starting acid reflux diet and above all starting gabapentin. With all these measures cough is resolved. RSI cough score is an loW 5. He is extremely happy. He is now completed 2 months of gabapentin. He is  compliant with all his treatment.   Dr Gretta Cool Reflux Symptom Index (> 13-15 suggestive of LPR cough)  11/28/2012  01/17/2013 - after restart symbicort, kouffmain diet and gbapentin  Hoarseness of problem with voice 3 2  Clearing  Of  Throat 4 1  Excess throat mucus or feeling of post nasal drip 4 1  Difficulty swallowing food, liquid or tablets 3 0  Cough after eating or lying down 2 0  Breathing difficulties or choking episodes 0 0  Troublesome or annoying cough 4 0  Sensation of something sticking in throat or lump in throat 3 0  Heartburn, chest pain, indigestion, or stomach acid coming up 0 1  TOTAL 23 5      Kouffman Reflux v Neurogenic Cough Differentiator Reflux 11/28/2012  01/17/2013   Do you awaken from a sound sleep coughing violently?                            With trouble breathing? Yes n  Do you have choking episodes when you cannot  Get enough air, gasping for air ?              no n  Do you usually cough when you lie down into  The bed, or when you just lie down to rest ?                          no n  Do you usually cough after meals or eating?         Yes n  Do you cough when (or after) you bend over?    no n  GERD SCORE  2 0  Kouffman Reflux v Neurogenic Cough Differentiator Neurogenic   Do you more-or-less cough all day long? yes n  Does change of temperature make you cough? n n  Does laughing or chuckling cause you to cough? yes n  Do fumes (perfume, automobile fumes, burned  Toast, etc.,) cause you to cough ?      n  sometimes automobile fumes   Does speaking, singing, or talking on the phone cause you to cough   ?               n  sometimes voice hoarse   Neurogenic/Airway score 2 1     Past, Family, Social reviewed: no change since last visit     Review of Systems  Constitutional: Negative for fever and unexpected weight change.  HENT: Negative for ear pain, nosebleeds, congestion, sore throat, rhinorrhea, sneezing, trouble swallowing, dental  problem, postnasal drip and sinus pressure.   Eyes: Negative for redness and itching.  Respiratory: Negative for cough, chest tightness, shortness of breath and wheezing.   Cardiovascular: Negative for palpitations and leg swelling.  Gastrointestinal: Negative for nausea and vomiting.  Genitourinary: Negative for dysuria.  Musculoskeletal: Negative for joint swelling.  Skin: Negative for rash.  Neurological: Negative for headaches.  Hematological: Does not bruise/bleed easily.  Psychiatric/Behavioral: Negative for dysphoric mood. The patient is not nervous/anxious.   Current outpatient prescriptions:albuterol (PROVENTIL HFA;VENTOLIN HFA) 108 (90 BASE) MCG/ACT inhaler, Inhale 2 puffs into the lungs every 6 (six) hours as needed. For allergies., Disp: 1 Inhaler, Rfl: 3;  aspirin EC 81 MG tablet, Take 81 mg by mouth at bedtime., Disp: , Rfl: ;  atorvastatin (LIPITOR) 10 MG tablet, Take 1 tablet (10 mg total) by mouth daily., Disp: 90 tablet, Rfl: 3 esomeprazole (NEXIUM) 40 MG capsule, Take 1 capsule (40 mg total) by mouth daily before breakfast. ID# [id number], Disp: 90 capsule, Rfl: 3;  gabapentin (NEURONTIN) 300 MG capsule, Take 300 mg by mouth 3 (three) times daily. 300mg  TID, Disp: , Rfl: ;  mometasone (NASONEX) 50 MCG/ACT nasal spray, Place 2 sprays into the nose daily., Disp: , Rfl:  montelukast (SINGULAIR) 10 MG tablet, Take 1 tablet (10 mg total) by mouth at bedtime., Disp: 90 tablet, Rfl: 3;  olmesartan (BENICAR) 20 MG tablet, Take 1 tablet (20 mg total) by mouth daily., Disp: 90 tablet, Rfl: 3;  predniSONE (DELTASONE) 10 MG tablet, Take 1 tablet (10 mg total) by mouth daily., Disp: 11 tablet, Rfl: 0;  simethicone (MYLICON) 80 MG chewable tablet, Chew 80 mg by mouth every 6 (six) hours as needed. For gas., Disp: , Rfl:  zolpidem (AMBIEN) 10 MG tablet, Take 1 tablet (10 mg total) by mouth at bedtime as needed. For sleep., Disp: 90 tablet, Rfl: 1      Objective:   Physical Exam Nursing note  and vitals reviewed. Constitutional: He is oriented to person, place, and time. He appears well-developed and well-nourished. No distress.   no more clearing of the throat  HENT:  Head: Normocephalic and atraumatic.  Right Ear: External ear normal.  Left Ear: External ear normal.  Mouth/Throat: Oropharynx is clear and moist. No oropharyngeal exudate.  Eyes: Conjunctivae and EOM are normal. Pupils are equal, round, and reactive to light. Right eye exhibits no discharge. Left eye exhibits no discharge. No scleral icterus.  Neck: Normal range of motion. Neck supple. No JVD present. No tracheal deviation present. No thyromegaly present.  Cardiovascular: Normal rate, regular rhythm and intact distal pulses.  Exam reveals no gallop and no friction rub.   No murmur heard. Pulmonary/Chest: Effort normal and breath sounds normal. No respiratory distress  He has no rales. He exhibits no tenderness.  Abdominal: Soft. Bowel sounds are normal. He exhibits no distension and no mass. There is no tenderness. There is no rebound and no guarding.  Musculoskeletal: Normal range of motion. He exhibits no edema and no tenderness.  Lymphadenopathy:    He has no cervical adenopathy.  Neurological: He is alert and oriented to person, place, and time. He has normal reflexes. No cranial nerve deficit. Coordination normal.  Skin: Skin is warm and dry. No rash noted. He is not diaphoretic. No erythema. No pallor.  Psychiatric: He has a normal mood and affect. His behavior is normal. Judgment and thought content normal.          Assessment & Plan:

## 2013-01-17 NOTE — Patient Instructions (Addendum)
Glad is significantly better and cough is almost resolved continue to follow the below advice otherwise cough could come back  #Sinus drainage  - continue 3% hypertonic nasal saline spray (OTC) at night 2 squirts - continue chlortrimeton - - continue nasal steroid generic fluticasone inhaler 2 squirts each nostril daily as advised    #Possible Acid Reflux  - Continue proton pump inhibitor  - At all times avoid colas, spices, cheeses, spirits, red meats, beer, chocolates, fried foods etc.,   - sleep with head end of bed elevated  - eat small frequent meals  - do not go to bed for 3 hours after last meal - follow Dr Kouffman diet  #Asthma  - Symbicort 80/4.5 2puff twice daily - continue singulair    #Cyclical cough/Irritable Larynx  - at all times there  there is urge to cough, drink water or swallow or sip on throat lozenge or even do voice rest - Regarding gabapentin  - Month of September:  take 300 mg twice daily  - Month of October:  take 300 mg once daily  Month of November:  take 300 mg 2 times a week  Month of December and on onwards: Do not take    #Followup - FU end of December 2014 - Cough score at followup Return earlier if worse 

## 2013-01-21 NOTE — Assessment & Plan Note (Signed)
Glad is significantly better and cough is almost resolved continue to follow the below advice otherwise cough could come back  #Sinus drainage  - continue 3% hypertonic nasal saline spray (OTC) at night 2 squirts - continue chlortrimeton - - continue nasal steroid generic fluticasone inhaler 2 squirts each nostril daily as advised    #Possible Acid Reflux  - Continue proton pump inhibitor  - At all times avoid colas, spices, cheeses, spirits, red meats, beer, chocolates, fried foods etc.,   - sleep with head end of bed elevated  - eat small frequent meals  - do not go to bed for 3 hours after last meal - follow Dr Gretta Cool diet  #Asthma  - Symbicort 80/4.5 2puff twice daily - continue singulair    #Cyclical cough/Irritable Larynx  - at all times there  there is urge to cough, drink water or swallow or sip on throat lozenge or even do voice rest - Regarding gabapentin  - Month of September:  take 300 mg twice daily  - Month of October:  take 300 mg once daily  Month of November:  take 300 mg 2 times a week  Month of December and on onwards: Do not take    #Followup - FU end of December 2014 - Cough score at followup Return earlier if worse

## 2013-01-29 ENCOUNTER — Other Ambulatory Visit: Payer: Self-pay | Admitting: Internal Medicine

## 2013-01-30 ENCOUNTER — Other Ambulatory Visit: Payer: Self-pay | Admitting: *Deleted

## 2013-01-30 MED ORDER — GABAPENTIN 300 MG PO CAPS: ORAL_CAPSULE | ORAL | Status: DC

## 2013-03-07 ENCOUNTER — Ambulatory Visit (INDEPENDENT_AMBULATORY_CARE_PROVIDER_SITE_OTHER): Payer: BC Managed Care – PPO | Admitting: Internal Medicine

## 2013-03-07 ENCOUNTER — Encounter: Payer: Self-pay | Admitting: Internal Medicine

## 2013-03-07 ENCOUNTER — Other Ambulatory Visit (INDEPENDENT_AMBULATORY_CARE_PROVIDER_SITE_OTHER): Payer: BC Managed Care – PPO

## 2013-03-07 VITALS — BP 128/86 | HR 66 | Temp 98.7°F | Resp 16 | Ht 71.0 in | Wt 197.0 lb

## 2013-03-07 DIAGNOSIS — Z0000 Encounter for general adult medical examination without abnormal findings: Secondary | ICD-10-CM

## 2013-03-07 DIAGNOSIS — I10 Essential (primary) hypertension: Secondary | ICD-10-CM

## 2013-03-07 DIAGNOSIS — R7309 Other abnormal glucose: Secondary | ICD-10-CM

## 2013-03-07 DIAGNOSIS — Z23 Encounter for immunization: Secondary | ICD-10-CM

## 2013-03-07 LAB — BASIC METABOLIC PANEL
CO2: 29 mEq/L (ref 19–32)
Calcium: 8.9 mg/dL (ref 8.4–10.5)
Creatinine, Ser: 1.1 mg/dL (ref 0.4–1.5)
GFR: 71.61 mL/min (ref >60.00)
Sodium: 140 mEq/L (ref 135–145)

## 2013-03-07 LAB — CBC WITH DIFFERENTIAL/PLATELET
Basophils Absolute: 0.1 10*3/uL (ref 0.0–0.1)
Eosinophils Absolute: 0.2 10*3/uL (ref 0.0–0.7)
HCT: 42.2 % (ref 39.0–52.0)
Lymphs Abs: 2.3 10*3/uL (ref 0.7–4.0)
MCHC: 34.5 g/dL (ref 30.0–36.0)
Monocytes Relative: 8.5 % (ref 3.0–12.0)
Neutro Abs: 4.2 10*3/uL (ref 1.4–7.7)
Neutrophils Relative %: 56.8 % (ref 43.0–77.0)
Platelets: 190 10*3/uL (ref 150.0–400.0)
RDW: 13.6 % (ref 11.5–14.6)

## 2013-03-07 LAB — HEMOGLOBIN A1C: Hgb A1c MFr Bld: 5.7 % (ref 4.6–6.5)

## 2013-03-07 NOTE — Assessment & Plan Note (Signed)
His BP is well controlled Today I will check his lytes and renal function 

## 2013-03-07 NOTE — Assessment & Plan Note (Signed)
I will check his A1C to see if he has developed DM2 

## 2013-03-07 NOTE — Progress Notes (Signed)
  Subjective:    Patient ID: Kevin Aguirre, male    DOB: 01-23-1951, 62 y.o.   MRN: [id number]  Hypertension This is a chronic problem. The current episode started more than 1 year ago. The problem has been gradually improving since onset. The problem is controlled. Pertinent negatives include no anxiety, blurred vision, chest pain, headaches, malaise/fatigue, neck pain, orthopnea, palpitations, peripheral edema, PND, shortness of breath or sweats. Past treatments include angiotensin blockers. The current treatment provides significant improvement. There are no compliance problems.  Hypertensive end-organ damage includes CAD/MI.      Review of Systems  Constitutional: Negative.  Negative for fever, chills, malaise/fatigue, diaphoresis, appetite change and fatigue.  HENT: Negative.   Eyes: Negative.  Negative for blurred vision.  Respiratory: Negative.  Negative for apnea, cough, choking, chest tightness, shortness of breath, wheezing and stridor.   Cardiovascular: Negative.  Negative for chest pain, palpitations, orthopnea, leg swelling and PND.  Gastrointestinal: Negative.  Negative for abdominal pain.  Endocrine: Negative.   Genitourinary: Negative.   Musculoskeletal: Negative.  Negative for arthralgias, back pain, gait problem, joint swelling, myalgias, neck pain and neck stiffness.  Skin: Negative.   Allergic/Immunologic: Negative.   Neurological: Negative.  Negative for dizziness, tremors, weakness, light-headedness and headaches.  Hematological: Negative.  Negative for adenopathy. Does not bruise/bleed easily.  Psychiatric/Behavioral: Negative.        Objective:   Physical Exam  Vitals reviewed. Constitutional: He is oriented to person, place, and time. He appears well-developed and well-nourished. No distress.  HENT:  Head: Normocephalic and atraumatic.  Mouth/Throat: Oropharynx is clear and moist. No oropharyngeal exudate.  Eyes: Conjunctivae are normal. Right eye  exhibits no discharge. Left eye exhibits no discharge. No scleral icterus.  Neck: Normal range of motion. Neck supple. No JVD present. No tracheal deviation present. No thyromegaly present.  Cardiovascular: Normal rate, regular rhythm, normal heart sounds and intact distal pulses.  Exam reveals no gallop and no friction rub.   No murmur heard. Pulmonary/Chest: Effort normal and breath sounds normal. No stridor. No respiratory distress. He has no wheezes. He has no rales. He exhibits no tenderness.  Abdominal: Soft. Bowel sounds are normal. He exhibits no distension and no mass. There is no tenderness. There is no rebound and no guarding.  Musculoskeletal: Normal range of motion. He exhibits no edema and no tenderness.  Lymphadenopathy:    He has no cervical adenopathy.  Neurological: He is oriented to person, place, and time.  Skin: Skin is warm and dry. No rash noted. He is not diaphoretic. No erythema. No pallor.  Psychiatric: He has a normal mood and affect. His behavior is normal. Judgment and thought content normal.     Lab Results  Component Value Date   WBC 12.5* 09/12/2012   HGB 14.4 09/12/2012   HCT 39.8 09/12/2012   PLT 216 09/12/2012   GLUCOSE 119* 09/12/2012   CHOL 159 05/15/2012   TRIG 140.0 05/15/2012   HDL 37.00* 05/15/2012   LDLCALC 94 05/15/2012   ALT 34 09/11/2012   AST 31 09/11/2012   NA 137 09/12/2012   K 3.9 09/12/2012   CL 100 09/12/2012   CREATININE 0.99 09/12/2012   BUN 11 09/12/2012   CO2 27 09/12/2012   TSH 1.37 05/15/2012   PSA 1.10 05/15/2012   HGBA1C 5.9* 09/12/2012       Assessment & Plan:

## 2013-03-07 NOTE — Addendum Note (Signed)
Addended by: Rock Nephew T on: 03/07/2013 11:32 AM   Modules accepted: Orders

## 2013-03-07 NOTE — Patient Instructions (Signed)

## 2013-04-26 ENCOUNTER — Other Ambulatory Visit: Payer: Self-pay | Admitting: Internal Medicine

## 2013-04-26 MED ORDER — ZOLPIDEM TARTRATE 10 MG PO TABS: 10.0000 mg | ORAL_TABLET | Freq: Every evening | ORAL | Status: AC | PRN

## 2013-06-20 ENCOUNTER — Encounter: Payer: Self-pay | Admitting: Internal Medicine

## 2013-06-20 ENCOUNTER — Ambulatory Visit (INDEPENDENT_AMBULATORY_CARE_PROVIDER_SITE_OTHER)
Admission: RE | Admit: 2013-06-20 | Discharge: 2013-06-20 | Disposition: A | Discharge status: Home / Self Care | Payer: BC Managed Care – PPO | Source: Ambulatory Visit | Attending: Internal Medicine | Admitting: Internal Medicine

## 2013-06-20 ENCOUNTER — Ambulatory Visit (INDEPENDENT_AMBULATORY_CARE_PROVIDER_SITE_OTHER): Payer: BC Managed Care – PPO | Admitting: Internal Medicine

## 2013-06-20 VITALS — BP 126/88 | HR 64 | Temp 98.3°F | Resp 16 | Ht 71.0 in | Wt 199.0 lb

## 2013-06-20 DIAGNOSIS — M7700 Medial epicondylitis, unspecified elbow: Secondary | ICD-10-CM

## 2013-06-20 DIAGNOSIS — M79641 Pain in right hand: Secondary | ICD-10-CM | POA: Insufficient documentation

## 2013-06-20 DIAGNOSIS — M79609 Pain in unspecified limb: Secondary | ICD-10-CM

## 2013-06-20 MED ORDER — IBUPROFEN 600 MG PO TABS: 600.0000 mg | ORAL_TABLET | Freq: Three times a day (TID) | ORAL | Status: AC | PRN

## 2013-06-20 NOTE — Progress Notes (Signed)
   Subjective:    Patient ID: Kevin Aguirre, male    DOB: November 20, 1950, 63 y.o.   MRN: [id number]  Arthritis Presents for initial visit. The disease course has been fluctuating. The condition has lasted for 3 weeks. He complains of pain. He reports no stiffness, joint swelling or joint warmth. Affected location: right hand and left elbow. His pain is at a severity of 3/10. Pertinent negatives include no diarrhea, dry eyes, dry mouth, dysuria, fatigue, fever, pain at night, pain while resting, rash, Raynaud's syndrome, uveitis or weight loss. His past medical history is significant for osteoarthritis and rheumatoid arthritis. There is no history of chronic back pain, lupus or psoriasis.  His pertinent risk factors include overuse. Past treatments include acetaminophen, rest, NSAIDs and cold. The treatment provided mild relief. Factors aggravating his arthritis include activity. Compliance with prior treatments has been variable.      Review of Systems  Constitutional: Negative for fever, weight loss and fatigue.  Gastrointestinal: Negative for diarrhea.  Genitourinary: Negative for dysuria.  Musculoskeletal: Positive for arthritis. Negative for joint swelling and stiffness.  Skin: Negative for rash.  All other systems reviewed and are negative.       Objective:   Physical Exam  Vitals reviewed. Constitutional: He is oriented to person, place, and time. He appears well-developed and well-nourished. No distress.  HENT:  Head: Normocephalic and atraumatic.  Mouth/Throat: Oropharynx is clear and moist. No oropharyngeal exudate.  Eyes: Conjunctivae are normal. Right eye exhibits no discharge. Left eye exhibits no discharge. No scleral icterus.  Neck: Normal range of motion. Neck supple. No JVD present. No tracheal deviation present. No thyromegaly present.  Cardiovascular: Normal rate, regular rhythm, normal heart sounds and intact distal pulses.  Exam reveals no gallop and no friction  rub.   No murmur heard. Pulmonary/Chest: Effort normal and breath sounds normal. No stridor. No respiratory distress. He has no wheezes. He has no rales. He exhibits no tenderness.  Abdominal: Soft. Bowel sounds are normal. He exhibits no distension and no mass. There is no tenderness. There is no rebound and no guarding.  Musculoskeletal: Normal range of motion. He exhibits no edema and no tenderness.       Left elbow: He exhibits normal range of motion, no swelling, no effusion, no deformity and no laceration. No radial head, no lateral epicondyle and no olecranon process tenderness noted.       Right hand: He exhibits bony tenderness. He exhibits normal range of motion, normal two-point discrimination, normal capillary refill, no deformity, no laceration and no swelling. Normal sensation noted. Normal strength noted.       Hands: Lymphadenopathy:    He has no cervical adenopathy.  Neurological: He is oriented to person, place, and time.  Skin: Skin is warm and dry. No rash noted. He is not diaphoretic. No erythema. No pallor.          Assessment & Plan:

## 2013-06-20 NOTE — Assessment & Plan Note (Signed)
Rest, ice, and pt ed material Also, he will try motrin for the pain

## 2013-06-20 NOTE — Assessment & Plan Note (Signed)
This looks like DJD but may also be tenosynovitis I will check a plain film to get an idea of how much DJD there is He will try motrin for the pain

## 2013-06-20 NOTE — Progress Notes (Signed)
Pre visit review using our clinic review tool, if applicable. No additional management support is needed unless otherwise documented below in the visit note. 

## 2013-06-20 NOTE — Patient Instructions (Signed)
Medial Epicondylitis (Golfer's Elbow) with Rehab Medial epicondylitis involves inflammation and pain around the inner (medial) portion of the elbow. This pain is caused by inflammation of the tendons in the forearm that flex (bring down) the wrist. Medial epicondylitis is also called golfer's elbow, because it is common among golfers. However, it may occur in any individual who flexes the wrist regularly. If medial epicondylitis is left untreated, it may become a chronic problem. SYMPTOMS   Pain, tenderness, or inflammation over the inner (medial) side of the elbow.  Pain or weakness with gripping activities.  Pain that increases with wrist twisting motions (using a screwdriver, playing golf, bowling). CAUSES  Medial epicondylitis is caused by inflammation of the tendons that flex the wrist. Causes of injury may include:  Chronic, repetitive stress and strain to the tendons that run from the wrist and forearm to the elbow.  Sudden strain on the forearm, including wrist snap when serving balls with racquet sports, or throwing a baseball. RISK INCREASES WITH:  Sports or occupations that require repetitive and/or strenuous forearm and wrist movements (pitching a baseball, golfing, carpentry).  Poor wrist and forearm strength and flexibility.  Failure to warm up properly before activity.  Resuming activity before healing, rehabilitation, and conditioning are complete. PREVENTION   Warm up and stretch properly before activity.  Maintain physical fitness:  Strength, flexibility, and endurance.  Cardiovascular fitness.  Wear and use properly fitted equipment.  Learn and use proper technique and have a coach correct improper technique.  Wear a tennis elbow (counterforce) brace. PROGNOSIS  The course of this condition depends on the degree of the injury. If treated properly, acute cases (symptoms lasting less than 4 weeks) are often resolved in 2 to 6 weeks. Chronic (longer lasting  cases) often resolve in 3 to 6 months, but may require physical therapy. RELATED COMPLICATIONS   Frequently recurring symptoms, resulting in a chronic problem. Properly treating the problem the first time decreases frequency of recurrence.  Chronic inflammation, scarring, and partial tendon tear, requiring surgery.  Delayed healing or resolution of symptoms. TREATMENT  Treatment first involves the use of ice and medicine, to reduce pain and inflammation. Strengthening and stretching exercises may reduce discomfort, if performed regularly. These exercises may be performed at home, if the condition is an acute injury. Chronic cases may require a referral to a physical therapist for evaluation and treatment. Your caregiver may advise a corticosteroid injection to help reduce inflammation. Rarely, surgery is needed. MEDICATION  If pain medicine is needed, nonsteroidal anti-inflammatory medicines (aspirin and ibuprofen), or other minor pain relievers (acetaminophen), are often advised.  Do not take pain medicine for 7 days before surgery.  Prescription pain relievers may be given, if your caregiver thinks they are needed. Use only as directed and only as much as you need.  Corticosteroid injections may be recommended. These injections should be reserved only for the most severe cases, because they can only be given a certain number of times. HEAT AND COLD  Cold treatment (icing) should be applied for 10 to 15 minutes every 2 to 3 hours for inflammation and pain, and immediately after activity that aggravates your symptoms. Use ice packs or an ice massage.  Heat treatment may be used before performing stretching and strengthening activities prescribed by your caregiver, physical therapist, or athletic trainer. Use a heat pack or a warm water soak. SEEK MEDICAL CARE IF: Symptoms get worse or do not improve in 2 weeks, despite treatment. EXERCISES  RANGE OF MOTION (  prescribed by your caregiver, physical therapist, or athletic trainer. Use a heat pack or a warm water soak.  SEEK MEDICAL CARE IF:  Symptoms get worse or do not improve in 2 weeks, despite treatment.  EXERCISES   RANGE OF MOTION (ROM) AND STRETCHING EXERCISES -  Epicondylitis, Medial (Golfer's Elbow)  These exercises may help you when beginning to rehabilitate your injury. Your symptoms may go away with or without further involvement from your physician, physical therapist or athletic trainer. While completing these exercises, remember:   · Restoring tissue flexibility helps normal motion to return to the joints. This allows healthier, less painful movement and activity.  · An effective stretch should be held for at least 30 seconds.  · A stretch should never be painful. You should only feel a gentle lengthening or release in the stretched tissue.  RANGE OF MOTION  Wrist Flexion, Active-Assisted  · Extend your right / left elbow with your fingers pointing down.*  · Gently pull the back of your hand towards you, until you feel a gentle stretch on the top of your forearm.  · Hold this position for __________ seconds.  Repeat __________ times. Complete this exercise __________ times per day.   *If directed by your physician, physical therapist or athletic trainer, complete this stretch with your elbow bent, rather than extended.  RANGE OF MOTION  Wrist Extension, Active-Assisted  · Extend your right / left elbow and turn your palm upwards.*  · Gently pull your palm and fingertips back, so your wrist extends and your fingers point more toward the ground.  · You should feel a gentle stretch on the inside of your forearm.  · Hold this position for __________ seconds.  Repeat __________ times. Complete this exercise __________ times per day.  *If directed by your physician, physical therapist or athletic trainer, complete this stretch with your elbow bent, rather than extended.  STRETCH  Wrist Extension   · Place your right / left fingertips on a tabletop leaving your elbow slightly bent. Your fingers should point backwards.  · Gently press your fingers and palm down onto the table, by straightening your elbow. You should feel a stretch on the inside of your forearm.  · Hold this  position for __________ seconds.  Repeat __________ times. Complete this stretch __________ times per day.   STRENGTHENING EXERCISES - Epicondylitis, Medial (Golfer's Elbow)  These exercises may help you when beginning to rehabilitate your injury. They may resolve your symptoms with or without further involvement from your physician, physical therapist or athletic trainer. While completing these exercises, remember:   · Muscles can gain both the endurance and the strength needed for everyday activities through controlled exercises.  · Complete these exercises as instructed by your physician, physical therapist or athletic trainer. Increase the resistance and repetitions only as guided.  · You may experience muscle soreness or fatigue, but the pain or discomfort you are trying to eliminate should never worsen during these exercises. If this pain does get worse, stop and make sure you are following the directions exactly. If the pain is still present after adjustments, discontinue the exercise until you can discuss the trouble with your caregiver.  STRENGTH Wrist Flexors  · Sit with your right / left forearm palm-up, and fully supported on a table or countertop. Your elbow should be resting below the height of your shoulder. Allow your wrist to extend over the edge of the surface.  · Loosely holding a __________ weight, or a piece   of rubber exercise band or tubing, slowly curl your hand up toward your forearm.  · Hold this position for __________ seconds. Slowly lower the wrist back to the starting position in a controlled manner.  Repeat __________ times. Complete this exercise __________ times per day.   STRENGTH  Wrist Extensors  · Sit with your right / left forearm palm-down and fully supported. Your elbow should be resting below the height of your shoulder. Allow your wrist to extend over the edge of the surface.  · Loosely holding a __________ weight, or a piece of rubber exercise band or tubing, slowly curl  your hand up toward your forearm.  · Hold this position for __________ seconds. Slowly lower the wrist back to the starting position in a controlled manner.  Repeat __________ times. Complete this exercise __________ times per day.   STRENGTH - Ulnar Deviators  · Stand with a ____________________ weight in your right / left hand, or sit while holding a rubber exercise band or tubing, with your healthy arm supported on a table or countertop.  · Move your wrist so that your pinkie travels toward your forearm and your thumb moves away from your forearm.  · Hold this position for __________ seconds and then slowly lower the wrist back to the starting position.  Repeat __________ times. Complete this exercise __________ times per day  STRENGTH - Grip   · Grasp a tennis ball, a dense sponge, or a large, rolled sock in your hand.  · Squeeze as hard as you can, without increasing any pain.  · Hold this position for __________ seconds. Release your grip slowly.  Repeat __________ times. Complete this exercise __________ times per day.   STRENGTH  Forearm Supinators   · Sit with your right / left forearm supported on a table, keeping your elbow below shoulder height. Rest your hand over the edge, palm down.  · Gently grip a hammer or a soup ladle.  · Without moving your elbow, slowly turn your palm and hand upward to a "thumbs-up" position.  · Hold this position for __________ seconds. Slowly return to the starting position.  Repeat __________ times. Complete this exercise __________ times per day.   STRENGTH  Forearm Pronators  · Sit with your right / left forearm supported on a table, keeping your elbow below shoulder height. Rest your hand over the edge, palm up.  · Gently grip a hammer or a soup ladle.  · Without moving your elbow, slowly turn your palm and hand upward to a "thumbs-up" position.  · Hold this position for __________ seconds. Slowly return to the starting position.  Repeat __________ times. Complete this  exercise __________ times per day.   Document Released: 05/10/2005 Document Revised: 08/02/2011 Document Reviewed: 08/22/2008  [employer]® Patient Information ©2014 [employer].

## 2013-07-15 ENCOUNTER — Other Ambulatory Visit: Payer: Self-pay | Admitting: Internal Medicine

## 2013-09-10 ENCOUNTER — Other Ambulatory Visit: Payer: Self-pay | Admitting: Internal Medicine

## 2013-11-22 ENCOUNTER — Other Ambulatory Visit: Payer: Self-pay | Admitting: Internal Medicine

## 2013-12-11 ENCOUNTER — Telehealth: Payer: Self-pay | Admitting: Internal Medicine

## 2013-12-11 MED ORDER — MONTELUKAST SODIUM 10 MG PO TABS: 10.0000 mg | ORAL_TABLET | Freq: Every day | ORAL | Status: DC

## 2013-12-11 NOTE — Telephone Encounter (Signed)
Pt aware RX called in to get him to his appt. Nothing further needed

## 2013-12-30 ENCOUNTER — Other Ambulatory Visit: Payer: Self-pay | Admitting: Internal Medicine

## 2014-01-15 ENCOUNTER — Ambulatory Visit (INDEPENDENT_AMBULATORY_CARE_PROVIDER_SITE_OTHER): Payer: BC Managed Care – PPO | Admitting: Internal Medicine

## 2014-01-15 ENCOUNTER — Encounter: Payer: Self-pay | Admitting: Internal Medicine

## 2014-01-15 VITALS — BP 156/84 | HR 69 | Ht 71.0 in | Wt 199.0 lb

## 2014-01-15 DIAGNOSIS — R05 Cough: Secondary | ICD-10-CM

## 2014-01-15 DIAGNOSIS — F458 Other somatoform disorders: Secondary | ICD-10-CM

## 2014-01-15 DIAGNOSIS — R053 Chronic cough: Secondary | ICD-10-CM

## 2014-01-15 DIAGNOSIS — R0989 Other specified symptoms and signs involving the circulatory and respiratory systems: Secondary | ICD-10-CM

## 2014-01-15 DIAGNOSIS — R059 Cough, unspecified: Secondary | ICD-10-CM

## 2014-01-15 DIAGNOSIS — J3489 Other specified disorders of nose and nasal sinuses: Secondary | ICD-10-CM

## 2014-01-15 NOTE — Patient Instructions (Signed)
Glad  cough is resolved past 1 year except for sensation of something in throat Time to start weaning cough treatment further  #Sinus drainage and stick sensation in throat  - continue 3% hypertonic nasal saline spray (OTC) at night 2 squirts - no need for anti histamine for now - continue nasal steroid generic fluticasone inhaler 2 squirts each nostril daily as advised  - REFER ENT   #Possible Acid Reflux - glad improved on Dr Lorenza [place] diet that I recommend you continue daily  - Ok to stop nexium and observe - if acid reflux  A problem will refer GI  #Asthma  - Continue Symbicort 80/4.5 2puff twice daily - STop  singulair and observed    #Cyclical cough/Irritable Larynx  - at all times there  there is urge to cough, drink water or swallow or sip on throat lozenge or even do voice rest   #Followup - FU 12 months - Cough score at followup - Return earlier if worse

## 2014-01-15 NOTE — Progress Notes (Signed)
Subjective:    Patient ID: Kevin Aguirre, male    DOB: 30-Jul-1950, 63 y.o.   MRN: [id number]  HPI  PCP Scarlette Calico, MD    Brief patient profile:  64 year old male yowm quit smoking 2003 with chronic cough developed while on ACEi stopped around 08/31/12    Body mass index is 27.35 kg/(m^2).     IOV 10/04/2012 AT baseline: have chronic spring allergies. Some seven years ago was given albuterol prn x 7 years. It was given to him [facility] [place] by a primary care on basis of "Allergies causing issue with breathing". He was using this albuterol 4-5 times a year for some wheeze. The wheeze would happen during spring time. These past 7 years thrugh 08/21/12 was living in [place] and found that albuterol use was only as above. Upon return to [place] end March 2014 has noticed increased use of albuterol in [place] . Also increased symptoms during 1 year sabbatical 2012-2013 in [place]. At baseline chronic cough: he has been having chronic cough that also corresponded to allergies/breathing and being in [place].   After current permanent return to [place], enlisted PMD Scarlette Calico, MD and ace ihibitor stopped but after that cough got worse. Concern was infectious. Hospitalized for "Acute bronchitis" 08/23/12 through 09/14/12. At discharge he was placed on symbicort on scheduled basis on an empiric basis at his request because he did not want to use albuterol prn which he was doing for years prior to admission (though intake was rare as above)  Since discharge he is better. Feels symbicort is helping somewhat but still feels he has chronic cough with clering of throat. PCP Scarlette Calico, MD felt he needs a pulmponary evaluation. Currently cough is mild. Quality is dry. Can be day or night. There is associated post nasal drainage, clearing of throat, ticke in throat, but no gagging. Cough made worse by spring, and [place]. Cough relieved by living in [place]   Cough relevant hx  #BP  - was on ace inhibitor but stopped in  March/April 2014  #Sinus    Known to have spring allergies. Never seen allergist. Takes zyrtec daily. Allergies worse in [place] than in [place]. Is not taking any nasal spray buit uses it only before flying  #GERD  - does have gerd. 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 Had to talk a lot  #Exposure - smoker. Quit 1994.  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 Cough is from sinus drainage, possible acid reflux, possible asthma, and voice fatigue t All of this is working together to cause cyclical cough/LPR cough 100% compliance with the below advice is important   REC #Sinus drainage  - start 3% hypertonic nasal saline spray (OTC) at night 2 squirts - continue Zyrtec - - start nasal steroid generic fluticasone inhaler 2 squirts each nostril daily as advised    #Possible Acid Reflux  - Continue proton pump inhibitor  - At all times avoid colas, spices, cheeses, spirits, red meats, beer, chocolates, fried foods etc.,   - sleep with head end of bed elevated  - eat small frequent meals  - do not go to bed for 3 hours after last meal  #Possible Asthma  - stop Symbicort for 2-3 weeks - Have full pulmonary function test in  2-3 weeks after stopping Symbicort   #Cyclical cough/Irritable Larynx  - please choose 2-3 days and observe complete voice rest - no talking or whispering  - at all times there  there is urge to cough, drink water or swallow or sip on throat lozenge  TELEPHOHE CALL 10/09/12 pft shows 10/09/2012  - fev1 2.3L/62%, R 58 with 16% bd response. TLC 90%. DLCO 76  - also significant VCD on flow volume loop. RT indicated difficulty with test   Please tell patient that pft shows asthma plus the vocal cord irrittation I spoke about So,  - Take prednisone 40 mg daily x 2 days, then 20mg  daily x 2 days, then 10mg   daily x 2 days, then 5mg  daily x 2 days and stop - restart symbicort prior mdi 2 puff twice daily - ensure refills  - continue sinus advised I gave at last OV  - follow GERD advice  - see me in 2-3 weeks in office with cough score and bedside spirometry so I can decide on vocal cord irritation Rx I spoke to him about   11/09/2012 f/u ov/[facility]  Chief Complaint  Patient presents with  . Acute Visit    Pt c/o increased cough for the past wk- prod with white to yellow sputum. He states cough comes and goes.     After several weeks off symbicort the cough started > resumed it 3 weeks prior to OV  Seemed to help some while on also prednisone (still had drainage down throat and throat clearing even on prednisone) then when finished pred cough started back 6/14 despite symbicort despite avoiding outdoor exposure.  Cough is min productive /worst in am after stirring and after supper   No obvious pattern to day to day  variabilty or assoc excess or purulent mucus or cp or chest tightness, subjective wheeze overt sinus or hb symptoms. No unusual exp hx or h/o childhood pna/ asthma or knowledge of premature birth.   Sleeping ok without nocturnal  or early am exacerbation  of respiratory  c/o's or need for noct saba. Also denies any obvious fluctuation of symptoms with weather or environmental changes or other aggravating or alleviating factors except as outlined above  Current Medications were reviewed in [place] record.       CT chest 09/12/12 - CT ANGIOGRAPHY CHEST  Technique: Multidetector CT imaging of the chest using the  standard protocol during bolus administration of intravenous  contrast. Multiplanar reconstructed images including MIPs were  obtained and reviewed to evaluate the vascular anatomy.  Contrast: 157mL OMNIPAQUE IOHEXOL 350 MG/ML SOLN  Comparison: 09/11/2012 radiograph, 08/17/2011 cardiac c t a  Findings: The pulmonary arterial branches are patent.    Minimal ascending ectasia, otherwise normal caliber aorta.  Heart size within normal limits. No pleural or pericardial  effusion.  No enlarged intrathoracic lymph nodes.  Limited images through the upper abdomen show hepatic steatosis.  Central airways are patent. No pneumothorax. Mild peripheral  reticular opacities/scarring. No confluent airspace opacity. No  new suspicious nodularity.  Multilevel degenerative changes. No acute osseous finding.  IMPRESSION:  No pulmonary embolism or acute intrathoracic process.  Original Report Authenticated By: Kevin Aguirre, M.D.   Arlyce Harman 10/04/2012 - normal    REC Prednisone 10 mg take  4 each am x 2 days,   2 each am x 2 days,  1 each am x 2 days and stop   Stop zyrtec and take chlortrimeton 4 mg with Pepcid 20 mg one  at bedtime - you can also use chlortrimeton 4 mg every 6 hours for drainage but may cause drowsiness  Stay off symbiocrt and start singulair 10 mg one at bedtime  GERD (REFLUX)  is an extremely common cause of respiratory symptoms, many times with no significant heartburn at all.    It can be treated with medication, but also with lifestyle changes including avoidance of late meals, excessive alcohol, smoking cessation, and avoid fatty foods, chocolate, peppermint, colas, red wine, and acidic juices such as orange juice.  NO MINT OR MENTHOL PRODUCTS SO NO COUGH DROPS  USE SUGARLESS CANDY INSTEAD (jolley ranchers or Stover's)  NO OIL BASED VITAMINS - use powdered substitutes.   For daytime cough take delsym when you can't afford to be sleepy from the codeine but the goal is no cough or throat clearing   Reschedule Kevin Aguirre for 2 weeks   OV 11/28/2012  Came off symbicort 3 weeks ago per Dr Kevin Aguirre advice and simulatneously started on singulair. Also zyrtec changed to chlortrimeton. Then 2 weeks started Dr Kevin Aguirre acid reflyux diet for chronic cough. With this cough seemed to improved bbut past few days cough  worse  REC   Cough is from sinus drainage, possible acid reflux, possible asthma, and voice fatigue All of this is working together to cause cyclical cough/LPR cough/irritable larynx  100% compliance with the below advice is important   #Sinus drainage  - continue 3% hypertonic nasal saline spray (OTC) at night 2 squirts  - continue chlortrimeton  - - continue nasal steroid generic fluticasone inhaler 2 squirts each nostril daily as advised   #Possible Acid Reflux  - Continue proton pump inhibitor  - At all times avoid colas, spices, cheeses, spirits, red meats, beer, chocolates, fried foods etc.,  - sleep with head end of bed elevated  - eat small frequent meals  - do not go to bed for 3 hours after last meal  - follow Dr Kevin Aguirre diet   #Asthma  -restart Symbicort 80/4.5 2puff twice daily  - continue singulair  - Please take prednisone 40 mg x1 day, then 30 mg x1 day, then 20 mg x1 day, then 10 mg x1 day, and then 5 mg x1 day and stop   #Cyclical cough/Irritable Larynx  - please choose 2-3 days and observe complete voice rest - no talking or whispering  - at all times there there is urge to cough, drink water or swallow or sip on throat lozenge  - refer you to speech therapy Kevin Aguirre  - Take gabapentin 300mg  once daily x 3 days, then 300mg  twice daily x 3 days, then 300mg  three times daily to continue. If this makes you too sleepy or drowsy call us and we will cut your medication dosing down   #Followup  - FU 6 weeks with cough score at followup  - spirometry at followupo   OV 01/17/2013    Followup for chronic multifactorial cough. At the last visit multiple measures were introduced including restarting Symbicort, continuing Singulair, continuing nasal inhalers, starting acid reflux diet and above all starting gabapentin. With all these measures cough is resolved. RSI cough score is an loW 5. He is extremely happy. He is now completed 2 months of gabapentin. He is  compliant with all his treatment.  REC Glad is significantly better and cough is almost resolved continue to follow the below advice otherwise cough could come back  #Sinus drainage  - continue 3% hypertonic nasal saline spray (OTC)  at night 2 squirts - continue chlortrimeton - - continue nasal steroid generic fluticasone inhaler 2 squirts each nostril daily as advised    #Possible Acid Reflux  - Continue proton pump inhibitor  - At all times avoid colas, spices, cheeses, spirits, red meats, beer, chocolates, fried foods etc.,   - sleep with head end of bed elevated  - eat small frequent meals  - do not go to bed for 3 hours after last meal - follow Dr Kevin Aguirre diet  #Asthma  - Symbicort 80/4.5 2puff twice daily - continue singulair    #Cyclical cough/Irritable Larynx  - at all times there  there is urge to cough, drink water or swallow or sip on throat lozenge or even do voice rest - Regarding gabapentin  - Month of September:  take 300 mg twice daily  - Month of October:  take 300 mg once daily  Month of November:  take 300 mg 2 times a week  Month of December and on onwards: Do not take    #Followup - FU end of December 2014 - Cough score at followup Return earlier if worse   OV 01/15/2014  Chief Complaint  Patient presents with  . Follow-up    1 yr follow up. Pt states he is doing very well. Pt denies SOB, cough, CP/tightness.     FU chronic cough - 1 year followup  Doing wel. Cough in remission. Contiues nasal saline and nasal steroids, nexium and Kouffman diet for GERD and symbicort/singulair for asthma. He is working out. Feels great. Wants to wean off nexium and singulair if possible. His main concern is globus sensation on throat that has not resolved at all despite all of above. He has prior smoking hx.   RSI cough score is 7 and reflects minimal symptoms  Past, Family, Social reviewed: no change since last visit     Review of Systems   Constitutional: Negative for fever and unexpected weight change.  HENT: Negative for congestion, dental problem, ear pain, nosebleeds, postnasal drip, rhinorrhea, sinus pressure, sneezing, sore throat and trouble swallowing.   Eyes: Negative for redness and itching.  Respiratory: Negative for cough, chest tightness, shortness of breath and wheezing.   Cardiovascular: Negative for palpitations and leg swelling.  Gastrointestinal: Negative for nausea and vomiting.  Genitourinary: Negative for dysuria.  Musculoskeletal: Negative for joint swelling.  Skin: Negative for rash.  Neurological: Negative for headaches.  Hematological: Does not bruise/bleed easily.  Psychiatric/Behavioral: Negative for dysphoric mood. The patient is not nervous/anxious.    Current outpatient prescriptions:albuterol (PROVENTIL HFA;VENTOLIN HFA) 108 (90 BASE) MCG/ACT inhaler, Inhale 2 puffs into the lungs every 6 (six) hours as needed. For allergies., Disp: 1 Inhaler, Rfl: 3;  aspirin EC 81 MG tablet, Take 81 mg by mouth at bedtime., Disp: , Rfl: ;  atorvastatin (LIPITOR) 10 MG tablet, TAKE 1 TABLET DAILY, Disp: 90 tablet, Rfl: 2;  BENICAR 20 MG tablet, TAKE 1 TABLET DAILY, Disp: 90 tablet, Rfl: 2 budesonide-formoterol (SYMBICORT) 160-4.5 MCG/ACT inhaler, INHALE 2 PUFFS INTO THE LUNGS TWICE A DAY///pt taking 2puffs once daily, Disp: , Rfl: ;  esomeprazole (NEXIUM) 40 MG capsule, TAKE 1 CAPSULE DAILY BEFORE BREAKFAST, Disp: 90 capsule, Rfl: 2;  Glucosamine-Chondroitin-MSM 500-400-125 MG TABS, Take by mouth. Pt taking every other day, Disp: , Rfl:  ibuprofen (ADVIL,MOTRIN) 600 MG tablet, Take 1 tablet (600 mg total) by mouth every 8 (eight) hours as needed., Disp: 90 tablet, Rfl: 2;  mometasone (NASONEX) 50 MCG/ACT nasal spray, Place 2  sprays into the nose daily., Disp: , Rfl: ;  montelukast (SINGULAIR) 10 MG tablet, Take 1 tablet (10 mg total) by mouth at bedtime., Disp: 30 tablet, Rfl: 0;  Multiple Vitamin (ONE-A-DAY MENS PO),  Take by mouth., Disp: , Rfl:  simethicone (MYLICON) 80 MG chewable tablet, Chew 80 mg by mouth every 6 (six) hours as needed. For gas., Disp: , Rfl: ;  sodium chloride (OCEAN) 0.65 % SOLN nasal spray, Place 1 spray into both nostrils at bedtime as needed for congestion., Disp: , Rfl: ;  Zinc 30 MG CAPS, Take by mouth. Every other day, Disp: , Rfl: ;  zolpidem (AMBIEN) 10 MG tablet, Take 1 tablet (10 mg total) by mouth at bedtime as needed. For sleep., Disp: 90 tablet, Rfl: 1      Objective:   Physical Exam  Nursing note and vitals reviewed. Constitutional: He is oriented to person, place, and time. He appears well-developed and well-nourished. No distress.  HENT:  Head: Normocephalic and atraumatic.  Right Ear: External ear normal.  Left Ear: External ear normal.  Mouth/Throat: Oropharynx is clear and moist. No oropharyngeal exudate.  Eyes: Conjunctivae and EOM are normal. Pupils are equal, round, and reactive to light. Right eye exhibits no discharge. Left eye exhibits no discharge. No scleral icterus.  Neck: Normal range of motion. Neck supple. No JVD present. No tracheal deviation present. No thyromegaly present.  Cardiovascular: Normal rate, regular rhythm and intact distal pulses.  Exam reveals no gallop and no friction rub.   No murmur heard. Pulmonary/Chest: Effort normal and breath sounds normal. No respiratory distress. He has no wheezes. He has no rales. He exhibits no tenderness.  Abdominal: Soft. Bowel sounds are normal. He exhibits no distension and no mass. There is no tenderness. There is no rebound and no guarding.  Musculoskeletal: Normal range of motion. He exhibits no edema and no tenderness.  Lymphadenopathy:    He has no cervical adenopathy.  Neurological: He is alert and oriented to person, place, and time. He has normal reflexes. No cranial nerve deficit. Coordination normal.  Skin: Skin is warm and dry. No rash noted. He is not diaphoretic. No erythema. No pallor.   Psychiatric: He has a normal mood and affect. His behavior is normal. Judgment and thought content normal.     Filed Vitals:   01/15/14 0950  BP: 156/84  Pulse: 69  Height: 5\' 11"  (1.803 m)  Weight: 199 lb (90.266 kg)  SpO2: 98%        Assessment & Plan:  Glad  cough is resolved past 1 year except for sensation of something in throat Time to start weaning cough treatment further  #Sinus drainage and stick sensation in throat  - continue 3% hypertonic nasal saline spray (OTC) at night 2 squirts - no need for anti histamine for now - continue nasal steroid generic fluticasone inhaler 2 squirts each nostril daily as advised  - REFER ENT   #Possible Acid Reflux - glad improved on Dr Kevin Aguirre diet that I recommend you continue daily  - Ok to stop nexium and observe - if acid reflux  A problem will refer GI  #Asthma  - Continue Symbicort 80/4.5 2puff twice daily - STop  singulair and observed    #Cyclical cough/Irritable Larynx  - at all times there  there is urge to cough, drink water or swallow or sip on throat lozenge or even do voice rest   #Followup - FU 12 months - Cough score at followup -  Return earlier if worse

## 2014-01-16 DIAGNOSIS — R0989 Other specified symptoms and signs involving the circulatory and respiratory systems: Secondary | ICD-10-CM | POA: Insufficient documentation

## 2014-01-16 DIAGNOSIS — R053 Chronic cough: Secondary | ICD-10-CM | POA: Insufficient documentation

## 2014-01-16 DIAGNOSIS — R05 Cough: Secondary | ICD-10-CM | POA: Insufficient documentation

## 2014-01-16 DIAGNOSIS — R09A2 Foreign body sensation, throat: Secondary | ICD-10-CM | POA: Insufficient documentation

## 2014-01-16 DIAGNOSIS — J3489 Other specified disorders of nose and nasal sinuses: Secondary | ICD-10-CM | POA: Insufficient documentation

## 2014-01-16 NOTE — Assessment & Plan Note (Signed)
#  Sinus drainage and stuck sensation in throat  - continue 3% hypertonic nasal saline spray (OTC) at night 2 squirts - no need for anti histamine for now - continue nasal steroid generic fluticasone inhaler 2 squirts each nostril daily as advised  - REFER ENT

## 2014-01-16 NOTE — Assessment & Plan Note (Signed)
Glad  cough is resolved past 1 year except for sensation of something in throat Time to start weaning cough treatment further  #Sinus drainage and stick sensation in throat  - continue 3% hypertonic nasal saline spray (OTC) at night 2 squirts - no need for anti histamine for now - continue nasal steroid generic fluticasone inhaler 2 squirts each nostril daily as advised  - REFER ENT   #Possible Acid Reflux - glad improved on Dr Lorenza Cambridge diet that I recommend you continue daily  - Ok to stop nexium and observe - if acid reflux  A problem will refer GI  #Asthma  - Continue Symbicort 80/4.5 2puff twice daily - STop  singulair and observed    #Cyclical cough/Irritable Larynx  - at all times there  there is urge to cough, drink water or swallow or sip on throat lozenge or even do voice rest   #Followup - FU 12 months - Cough score at followup - Return earlier if worse

## 2014-02-27 DIAGNOSIS — M25511 Pain in right shoulder: Secondary | ICD-10-CM | POA: Diagnosis not present

## 2014-02-27 DIAGNOSIS — M67911 Unspecified disorder of synovium and tendon, right shoulder: Secondary | ICD-10-CM | POA: Diagnosis not present

## 2014-03-12 DIAGNOSIS — M19011 Primary osteoarthritis, right shoulder: Secondary | ICD-10-CM | POA: Diagnosis not present

## 2014-03-13 ENCOUNTER — Other Ambulatory Visit: Payer: Self-pay

## 2014-03-13 MED ORDER — OLMESARTAN MEDOXOMIL 20 MG PO TABS: ORAL_TABLET | ORAL | Status: DC

## 2014-03-14 DIAGNOSIS — Z23 Encounter for immunization: Secondary | ICD-10-CM | POA: Diagnosis not present

## 2014-03-15 DIAGNOSIS — M67911 Unspecified disorder of synovium and tendon, right shoulder: Secondary | ICD-10-CM | POA: Diagnosis not present

## 2014-04-03 ENCOUNTER — Other Ambulatory Visit: Payer: Self-pay

## 2014-04-03 MED ORDER — ESOMEPRAZOLE MAGNESIUM 40 MG PO CPDR: DELAYED_RELEASE_CAPSULE | ORAL | Status: DC

## 2014-04-03 MED ORDER — ATORVASTATIN CALCIUM 10 MG PO TABS: ORAL_TABLET | ORAL | Status: DC

## 2014-04-03 MED ORDER — OLMESARTAN MEDOXOMIL 20 MG PO TABS: ORAL_TABLET | ORAL | Status: DC

## 2014-04-17 ENCOUNTER — Ambulatory Visit (INDEPENDENT_AMBULATORY_CARE_PROVIDER_SITE_OTHER): Payer: Medicare Other | Admitting: Internal Medicine

## 2014-04-17 ENCOUNTER — Encounter: Payer: Self-pay | Admitting: Internal Medicine

## 2014-04-17 ENCOUNTER — Other Ambulatory Visit: Payer: Self-pay | Admitting: *Deleted

## 2014-04-17 ENCOUNTER — Telehealth: Payer: Self-pay | Admitting: Internal Medicine

## 2014-04-17 VITALS — BP 116/80 | HR 73 | Temp 98.2°F | Ht 71.0 in | Wt 194.0 lb

## 2014-04-17 DIAGNOSIS — J0101 Acute recurrent maxillary sinusitis: Secondary | ICD-10-CM | POA: Diagnosis not present

## 2014-04-17 DIAGNOSIS — I10 Essential (primary) hypertension: Secondary | ICD-10-CM

## 2014-04-17 MED ORDER — CEFUROXIME AXETIL 500 MG PO TABS: 500.0000 mg | ORAL_TABLET | Freq: Two times a day (BID) | ORAL | Status: DC

## 2014-04-17 NOTE — Progress Notes (Signed)
Pre visit review using our clinic review tool, if applicable. No additional management support is needed unless otherwise documented below in the visit note. 

## 2014-04-17 NOTE — Assessment & Plan Note (Signed)
His BP is well controlled 

## 2014-04-17 NOTE — Patient Instructions (Signed)

## 2014-04-17 NOTE — Telephone Encounter (Signed)
Done

## 2014-04-17 NOTE — Progress Notes (Signed)
   Subjective:    Patient ID: Kevin Aguirre, male    DOB: [birth year], 63 y.o.   MRN: [id number]  Sinusitis This is a new problem. The current episode started in the past 7 days. The problem has been gradually worsening since onset. There has been no fever. The fever has been present for less than 1 day. His pain is at a severity of 0/10. He is experiencing no pain. Associated symptoms include chills, congestion, sinus pressure and a sore throat. Pertinent negatives include no coughing, diaphoresis, ear pain, headaches, hoarse voice, neck pain, shortness of breath, sneezing or swollen glands. Past treatments include nothing. The treatment provided no relief.      Review of Systems  Constitutional: Positive for chills. Negative for fever, diaphoresis and fatigue.  HENT: Positive for congestion, sinus pressure and sore throat. Negative for ear pain, hoarse voice, postnasal drip, rhinorrhea, sneezing, tinnitus and trouble swallowing.   Eyes: Negative.   Respiratory: Negative.  Negative for cough and shortness of breath.   Cardiovascular: Negative.  Negative for chest pain, palpitations and leg swelling.  Gastrointestinal: Negative.  Negative for abdominal pain.  Endocrine: Negative.   Genitourinary: Negative.   Musculoskeletal: Negative.  Negative for neck pain.  Skin: Negative.  Negative for rash.  Allergic/Immunologic: Negative.   Neurological: Negative.  Negative for headaches.  Hematological: Negative.  Negative for adenopathy. Does not bruise/bleed easily.  Psychiatric/Behavioral: Negative.        Objective:   Physical Exam  Constitutional: He is oriented to person, place, and time. He appears well-developed and well-nourished.  Non-toxic appearance. He does not have a sickly appearance. He does not appear ill. No distress.  HENT:  Head: Normocephalic and atraumatic.  Mouth/Throat: Oropharynx is clear and moist. No oropharyngeal exudate.  Eyes: Conjunctivae are normal. Right  eye exhibits no discharge. Left eye exhibits no discharge. No scleral icterus.  Neck: Normal range of motion. Neck supple. No JVD present. No tracheal deviation present. No thyromegaly present.  Cardiovascular: Normal rate, regular rhythm, normal heart sounds and intact distal pulses.  Exam reveals no gallop and no friction rub.   No murmur heard. Pulmonary/Chest: Effort normal and breath sounds normal. No stridor. No respiratory distress. He has no wheezes. He has no rales. He exhibits no tenderness.  Abdominal: Soft. Bowel sounds are normal. He exhibits no distension and no mass. There is no tenderness. There is no rebound and no guarding.  Musculoskeletal: Normal range of motion. He exhibits no edema or tenderness.  Lymphadenopathy:    He has no cervical adenopathy.  Neurological: He is oriented to person, place, and time.  Skin: Skin is warm and dry. No rash noted. He is not diaphoretic. No erythema. No pallor.  Psychiatric: He has a normal mood and affect. His behavior is normal. Judgment and thought content normal.  Vitals reviewed.         Assessment & Plan:

## 2014-04-17 NOTE — Assessment & Plan Note (Signed)
Will treat the infection with ceftin 

## 2014-04-17 NOTE — Telephone Encounter (Signed)
Pt called in said that meds were not at [facility] it was sent to mail oder cefUROXime (CEFTIN) 500 MG tablet [[id number]]

## 2014-06-19 ENCOUNTER — Other Ambulatory Visit: Payer: Self-pay | Admitting: Orthopedic Surgery

## 2014-06-26 ENCOUNTER — Encounter (HOSPITAL_BASED_OUTPATIENT_CLINIC_OR_DEPARTMENT_OTHER): Payer: Self-pay | Admitting: *Deleted

## 2014-06-26 NOTE — Progress Notes (Signed)
To come in for ekg-bmet-has not had much surgery-preop teaching and questions answered

## 2014-06-28 ENCOUNTER — Encounter (HOSPITAL_BASED_OUTPATIENT_CLINIC_OR_DEPARTMENT_OTHER)
Admission: RE | Admit: 2014-06-28 | Discharge: 2014-06-28 | Disposition: A | Discharge status: Home / Self Care | Payer: Medicare Other | Source: Ambulatory Visit | Attending: Orthopedic Surgery | Admitting: Orthopedic Surgery

## 2014-06-28 DIAGNOSIS — E785 Hyperlipidemia, unspecified: Secondary | ICD-10-CM | POA: Diagnosis not present

## 2014-06-28 DIAGNOSIS — M25811 Other specified joint disorders, right shoulder: Secondary | ICD-10-CM | POA: Diagnosis not present

## 2014-06-28 DIAGNOSIS — Y929 Unspecified place or not applicable: Secondary | ICD-10-CM | POA: Diagnosis not present

## 2014-06-28 DIAGNOSIS — Z7982 Long term (current) use of aspirin: Secondary | ICD-10-CM | POA: Diagnosis not present

## 2014-06-28 DIAGNOSIS — Y939 Activity, unspecified: Secondary | ICD-10-CM | POA: Diagnosis not present

## 2014-06-28 DIAGNOSIS — I10 Essential (primary) hypertension: Secondary | ICD-10-CM | POA: Diagnosis not present

## 2014-06-28 DIAGNOSIS — K219 Gastro-esophageal reflux disease without esophagitis: Secondary | ICD-10-CM | POA: Diagnosis not present

## 2014-06-28 DIAGNOSIS — Z87891 Personal history of nicotine dependence: Secondary | ICD-10-CM | POA: Diagnosis not present

## 2014-06-28 DIAGNOSIS — Y999 Unspecified external cause status: Secondary | ICD-10-CM | POA: Diagnosis not present

## 2014-06-28 DIAGNOSIS — M75101 Unspecified rotator cuff tear or rupture of right shoulder, not specified as traumatic: Secondary | ICD-10-CM | POA: Diagnosis not present

## 2014-06-28 DIAGNOSIS — M94211 Chondromalacia, right shoulder: Secondary | ICD-10-CM | POA: Diagnosis not present

## 2014-06-28 DIAGNOSIS — X58XXXA Exposure to other specified factors, initial encounter: Secondary | ICD-10-CM | POA: Diagnosis not present

## 2014-06-28 DIAGNOSIS — M25511 Pain in right shoulder: Secondary | ICD-10-CM | POA: Diagnosis present

## 2014-06-28 DIAGNOSIS — S43431A Superior glenoid labrum lesion of right shoulder, initial encounter: Secondary | ICD-10-CM | POA: Diagnosis not present

## 2014-06-28 DIAGNOSIS — Z79899 Other long term (current) drug therapy: Secondary | ICD-10-CM | POA: Diagnosis not present

## 2014-06-28 LAB — BASIC METABOLIC PANEL
ANION GAP: 3 — AB (ref 5–15)
BUN: 17 mg/dL (ref 6–23)
CO2: 31 mmol/L (ref 19–32)
Calcium: 9.1 mg/dL (ref 8.4–10.5)
Chloride: 105 mmol/L (ref 96–112)
Creatinine, Ser: 1.07 mg/dL (ref 0.50–1.35)
GFR calc non Af Amer: 71 mL/min — ABNORMAL LOW (ref >90)
GFR, EST AFRICAN AMERICAN: 82 mL/min — AB (ref >90)
GLUCOSE: 103 mg/dL — AB (ref 70–99)
POTASSIUM: 4.2 mmol/L (ref 3.5–5.1)
SODIUM: 139 mmol/L (ref 135–145)

## 2014-06-28 NOTE — Pre-Procedure Instructions (Signed)
EKG reviewed by Dr. Kalman Shan; OK to come for surgery.

## 2014-07-01 ENCOUNTER — Ambulatory Visit (HOSPITAL_BASED_OUTPATIENT_CLINIC_OR_DEPARTMENT_OTHER)
Admission: RE | Admit: 2014-07-01 | Discharge: 2014-07-01 | Disposition: A | Discharge status: Home / Self Care | Payer: Medicare Other | Source: Ambulatory Visit | Attending: Orthopedic Surgery | Admitting: Orthopedic Surgery

## 2014-07-01 ENCOUNTER — Encounter (HOSPITAL_BASED_OUTPATIENT_CLINIC_OR_DEPARTMENT_OTHER)
Admission: RE | Disposition: A | Discharge status: Home / Self Care | Payer: Self-pay | Source: Ambulatory Visit | Attending: Orthopedic Surgery

## 2014-07-01 ENCOUNTER — Ambulatory Visit (HOSPITAL_BASED_OUTPATIENT_CLINIC_OR_DEPARTMENT_OTHER): Payer: Medicare Other | Admitting: Anesthesiology

## 2014-07-01 ENCOUNTER — Encounter (HOSPITAL_BASED_OUTPATIENT_CLINIC_OR_DEPARTMENT_OTHER): Payer: Self-pay | Admitting: Anesthesiology

## 2014-07-01 DIAGNOSIS — M94211 Chondromalacia, right shoulder: Secondary | ICD-10-CM | POA: Diagnosis not present

## 2014-07-01 DIAGNOSIS — M65811 Other synovitis and tenosynovitis, right shoulder: Secondary | ICD-10-CM | POA: Diagnosis not present

## 2014-07-01 DIAGNOSIS — Z79899 Other long term (current) drug therapy: Secondary | ICD-10-CM | POA: Insufficient documentation

## 2014-07-01 DIAGNOSIS — M7541 Impingement syndrome of right shoulder: Secondary | ICD-10-CM | POA: Diagnosis not present

## 2014-07-01 DIAGNOSIS — E785 Hyperlipidemia, unspecified: Secondary | ICD-10-CM | POA: Diagnosis not present

## 2014-07-01 DIAGNOSIS — M25511 Pain in right shoulder: Secondary | ICD-10-CM | POA: Diagnosis not present

## 2014-07-01 DIAGNOSIS — M75101 Unspecified rotator cuff tear or rupture of right shoulder, not specified as traumatic: Secondary | ICD-10-CM | POA: Diagnosis not present

## 2014-07-01 DIAGNOSIS — S43431A Superior glenoid labrum lesion of right shoulder, initial encounter: Secondary | ICD-10-CM | POA: Insufficient documentation

## 2014-07-01 DIAGNOSIS — Y929 Unspecified place or not applicable: Secondary | ICD-10-CM | POA: Insufficient documentation

## 2014-07-01 DIAGNOSIS — S43491A Other sprain of right shoulder joint, initial encounter: Secondary | ICD-10-CM | POA: Diagnosis not present

## 2014-07-01 DIAGNOSIS — I10 Essential (primary) hypertension: Secondary | ICD-10-CM | POA: Diagnosis not present

## 2014-07-01 DIAGNOSIS — Y999 Unspecified external cause status: Secondary | ICD-10-CM | POA: Insufficient documentation

## 2014-07-01 DIAGNOSIS — X58XXXA Exposure to other specified factors, initial encounter: Secondary | ICD-10-CM | POA: Insufficient documentation

## 2014-07-01 DIAGNOSIS — Y939 Activity, unspecified: Secondary | ICD-10-CM | POA: Insufficient documentation

## 2014-07-01 DIAGNOSIS — M25811 Other specified joint disorders, right shoulder: Secondary | ICD-10-CM | POA: Insufficient documentation

## 2014-07-01 DIAGNOSIS — M24111 Other articular cartilage disorders, right shoulder: Secondary | ICD-10-CM | POA: Diagnosis not present

## 2014-07-01 DIAGNOSIS — Z7982 Long term (current) use of aspirin: Secondary | ICD-10-CM | POA: Insufficient documentation

## 2014-07-01 DIAGNOSIS — Z87891 Personal history of nicotine dependence: Secondary | ICD-10-CM | POA: Insufficient documentation

## 2014-07-01 DIAGNOSIS — G8918 Other acute postprocedural pain: Secondary | ICD-10-CM | POA: Diagnosis not present

## 2014-07-01 DIAGNOSIS — K219 Gastro-esophageal reflux disease without esophagitis: Secondary | ICD-10-CM | POA: Insufficient documentation

## 2014-07-01 HISTORY — DX: Personal history of other diseases of the respiratory system: Z87.09

## 2014-07-01 HISTORY — DX: Presence of spectacles and contact lenses: Z97.3

## 2014-07-01 HISTORY — PX: SHOULDER ARTHROSCOPY WITH SUBACROMIAL DECOMPRESSION: SHX5684

## 2014-07-01 SURGERY — SHOULDER ARTHROSCOPY WITH SUBACROMIAL DECOMPRESSION
Anesthesia: General | Site: Shoulder | Laterality: Right

## 2014-07-01 MED ORDER — CEFAZOLIN SODIUM-DEXTROSE 2-3 GM-% IV SOLR
INTRAVENOUS | Status: AC
  Filled 2014-07-01: qty 50

## 2014-07-01 MED ORDER — FENTANYL CITRATE 0.05 MG/ML IJ SOLN
INTRAMUSCULAR | Status: AC
  Filled 2014-07-01: qty 2

## 2014-07-01 MED ORDER — BUPIVACAINE-EPINEPHRINE (PF) 0.5% -1:200000 IJ SOLN
INTRAMUSCULAR | Status: DC | PRN
  Administered 2014-07-01: 25 mL via PERINEURAL

## 2014-07-01 MED ORDER — DEXAMETHASONE SODIUM PHOSPHATE 4 MG/ML IJ SOLN
INTRAMUSCULAR | Status: DC | PRN
  Administered 2014-07-01: 10 mg via INTRAVENOUS

## 2014-07-01 MED ORDER — CEFAZOLIN SODIUM-DEXTROSE 2-3 GM-% IV SOLR
2.0000 g | INTRAVENOUS | Status: AC
  Administered 2014-07-01: 2 g via INTRAVENOUS

## 2014-07-01 MED ORDER — SUCCINYLCHOLINE CHLORIDE 20 MG/ML IJ SOLN
INTRAMUSCULAR | Status: DC | PRN
  Administered 2014-07-01: 100 mg via INTRAVENOUS

## 2014-07-01 MED ORDER — FENTANYL CITRATE 0.05 MG/ML IJ SOLN
50.0000 ug | INTRAMUSCULAR | Status: DC | PRN
  Administered 2014-07-01: 100 ug via INTRAVENOUS

## 2014-07-01 MED ORDER — MIDAZOLAM HCL 5 MG/5ML IJ SOLN
INTRAMUSCULAR | Status: DC | PRN
  Administered 2014-07-01: 2 mg via INTRAVENOUS

## 2014-07-01 MED ORDER — ONDANSETRON HCL 4 MG/2ML IJ SOLN
INTRAMUSCULAR | Status: DC | PRN
  Administered 2014-07-01: 4 mg via INTRAVENOUS

## 2014-07-01 MED ORDER — DOCUSATE SODIUM 100 MG PO CAPS: 100.0000 mg | ORAL_CAPSULE | Freq: Three times a day (TID) | ORAL | Status: AC | PRN

## 2014-07-01 MED ORDER — MIDAZOLAM HCL 2 MG/2ML IJ SOLN
INTRAMUSCULAR | Status: AC
  Filled 2014-07-01: qty 2

## 2014-07-01 MED ORDER — SODIUM CHLORIDE 0.9 % IR SOLN
Status: DC | PRN
  Administered 2014-07-01: 6000 mL

## 2014-07-01 MED ORDER — LIDOCAINE HCL (CARDIAC) 20 MG/ML IV SOLN
INTRAVENOUS | Status: DC | PRN
  Administered 2014-07-01: 40 mg via INTRAVENOUS

## 2014-07-01 MED ORDER — BUPIVACAINE HCL (PF) 0.5 % IJ SOLN
INTRAMUSCULAR | Status: AC
  Filled 2014-07-01: qty 30

## 2014-07-01 MED ORDER — PROPOFOL 10 MG/ML IV BOLUS
INTRAVENOUS | Status: DC | PRN
Start: 2014-07-01 — End: 2014-07-01
  Administered 2014-07-01: 200 mg via INTRAVENOUS

## 2014-07-01 MED ORDER — LACTATED RINGERS IV SOLN
INTRAVENOUS | Status: DC
  Administered 2014-07-01 (×2): via INTRAVENOUS

## 2014-07-01 MED ORDER — POVIDONE-IODINE 7.5 % EX SOLN: Freq: Once | CUTANEOUS | Status: DC

## 2014-07-01 MED ORDER — OXYCODONE-ACETAMINOPHEN 5-325 MG PO TABS: 1.0000 | ORAL_TABLET | ORAL | Status: AC | PRN

## 2014-07-01 MED ORDER — MIDAZOLAM HCL 2 MG/2ML IJ SOLN
1.0000 mg | INTRAMUSCULAR | Status: DC | PRN
  Administered 2014-07-01: 2 mg via INTRAVENOUS

## 2014-07-01 MED ORDER — FENTANYL CITRATE 0.05 MG/ML IJ SOLN
INTRAMUSCULAR | Status: AC
  Filled 2014-07-01: qty 6

## 2014-07-01 SURGICAL SUPPLY — 88 items
BENZOIN TINCTURE PRP APPL 2/3 (GAUZE/BANDAGES/DRESSINGS) IMPLANT
BLADE CLIPPER SURG (BLADE) IMPLANT
BLADE SURG 15 STRL LF DISP TIS (BLADE) IMPLANT
BLADE SURG 15 STRL SS (BLADE)
BUR 3.5 LG SPHERICAL (BURR) IMPLANT
BUR OVAL 4.0 (BURR) ×3 IMPLANT
BURR 3.5 LG SPHERICAL (BURR)
BURR 3.5MM LG SPHERICAL (BURR)
CANNULA 5.75X71 LONG (CANNULA) ×3 IMPLANT
CANNULA TWIST IN 8.25X7CM (CANNULA) IMPLANT
CHLORAPREP W/TINT 26ML (MISCELLANEOUS) ×3 IMPLANT
CLOSURE WOUND 1/2 X4 (GAUZE/BANDAGES/DRESSINGS)
DECANTER SPIKE VIAL GLASS SM (MISCELLANEOUS) IMPLANT
DRAPE INCISE IOBAN 66X45 STRL (DRAPES) ×3 IMPLANT
DRAPE STERI 35X30 U-POUCH (DRAPES) ×3 IMPLANT
DRAPE SURG 17X23 STRL (DRAPES) ×3 IMPLANT
DRAPE U 20/CS (DRAPES) ×3 IMPLANT
DRAPE U-SHAPE 47X51 STRL (DRAPES) ×3 IMPLANT
DRAPE U-SHAPE 76X120 STRL (DRAPES) ×6 IMPLANT
DRSG PAD ABDOMINAL 8X10 ST (GAUZE/BANDAGES/DRESSINGS) ×3 IMPLANT
ELECT REM PT RETURN 9FT ADLT (ELECTROSURGICAL) ×3
ELECTRODE REM PT RTRN 9FT ADLT (ELECTROSURGICAL) ×1 IMPLANT
FIBERSTICK 2 (SUTURE) IMPLANT
GAUZE SPONGE 4X4 12PLY STRL (GAUZE/BANDAGES/DRESSINGS) ×3 IMPLANT
GAUZE SPONGE 4X4 16PLY XRAY LF (GAUZE/BANDAGES/DRESSINGS) IMPLANT
GAUZE XEROFORM 1X8 LF (GAUZE/BANDAGES/DRESSINGS) ×3 IMPLANT
GLOVE BIO SURGEON STRL SZ7 (GLOVE) ×3 IMPLANT
GLOVE BIO SURGEON STRL SZ7.5 (GLOVE) ×3 IMPLANT
GLOVE BIOGEL M STRL SZ7.5 (GLOVE) ×3 IMPLANT
GLOVE BIOGEL PI IND STRL 7.0 (GLOVE) ×1 IMPLANT
GLOVE BIOGEL PI IND STRL 8 (GLOVE) ×2 IMPLANT
GLOVE BIOGEL PI INDICATOR 7.0 (GLOVE) ×2
GLOVE BIOGEL PI INDICATOR 8 (GLOVE) ×4
GLOVE EXAM NITRILE MD LF STRL (GLOVE) ×3 IMPLANT
GOWN STRL REUS W/ TWL LRG LVL3 (GOWN DISPOSABLE) ×2 IMPLANT
GOWN STRL REUS W/TWL LRG LVL3 (GOWN DISPOSABLE) ×10 IMPLANT
KIT BIO-SUTURETAK 2.4 SPR TROC (KITS) IMPLANT
KIT PUSHLOCK 2.9 HIP (KITS) IMPLANT
LASSO 90 CVE QUICKPAS (DISPOSABLE) IMPLANT
LASSO CRESCENT QUICKPASS (SUTURE) IMPLANT
LIQUID BAND (GAUZE/BANDAGES/DRESSINGS) IMPLANT
MANIFOLD NEPTUNE II (INSTRUMENTS) ×3 IMPLANT
NDL SUT 6 .5 CRC .975X.05 MAYO (NEEDLE) IMPLANT
NEEDLE 1/2 CIR CATGUT .05X1.09 (NEEDLE) IMPLANT
NEEDLE MAYO TAPER (NEEDLE)
NS IRRIG 1000ML POUR BTL (IV SOLUTION) ×6 IMPLANT
PACK ARTHROSCOPY DSU (CUSTOM PROCEDURE TRAY) ×3 IMPLANT
PACK BASIN DAY SURGERY FS (CUSTOM PROCEDURE TRAY) ×3 IMPLANT
PENCIL BUTTON HOLSTER BLD 10FT (ELECTRODE) IMPLANT
RESECTOR FULL RADIUS 4.2MM (BLADE) ×3 IMPLANT
SHEET MEDIUM DRAPE 40X70 STRL (DRAPES) IMPLANT
SLEEVE SCD COMPRESS KNEE MED (MISCELLANEOUS) ×3 IMPLANT
SLING ARM IMMOBILIZER MED (SOFTGOODS) IMPLANT
SLING ARM LRG ADULT FOAM STRAP (SOFTGOODS) ×3 IMPLANT
SLING ARM MED ADULT FOAM STRAP (SOFTGOODS) IMPLANT
SLING ARM XL FOAM STRAP (SOFTGOODS) IMPLANT
SPONGE LAP 4X18 X RAY DECT (DISPOSABLE) IMPLANT
STRIP CLOSURE SKIN 1/2X4 (GAUZE/BANDAGES/DRESSINGS) IMPLANT
SUCTION FRAZIER TIP 10 FR DISP (SUCTIONS) IMPLANT
SUPPORT WRAP ARM LG (MISCELLANEOUS) IMPLANT
SUT ETHIBOND 2 OS 4 DA (SUTURE) IMPLANT
SUT ETHILON 3 0 PS 1 (SUTURE) ×3 IMPLANT
SUT ETHILON 4 0 PS 2 18 (SUTURE) IMPLANT
SUT FIBERWIRE #2 38 T-5 BLUE (SUTURE)
SUT FIBERWIRE 2-0 18 17.9 3/8 (SUTURE)
SUT MNCRL AB 3-0 PS2 18 (SUTURE) IMPLANT
SUT MNCRL AB 4-0 PS2 18 (SUTURE) IMPLANT
SUT PDS AB 0 CT 36 (SUTURE) IMPLANT
SUT PROLENE 3 0 PS 2 (SUTURE) IMPLANT
SUT TIGER TAPE 7 IN WHITE (SUTURE) IMPLANT
SUT VIC AB 0 CT1 27 (SUTURE)
SUT VIC AB 0 CT1 27XBRD ANBCTR (SUTURE) IMPLANT
SUT VIC AB 2-0 SH 27 (SUTURE)
SUT VIC AB 2-0 SH 27XBRD (SUTURE) IMPLANT
SUTURE FIBERWR #2 38 T-5 BLUE (SUTURE) IMPLANT
SUTURE FIBERWR 2-0 18 17.9 3/8 (SUTURE) IMPLANT
SYR BULB 3OZ (MISCELLANEOUS) IMPLANT
TAPE FIBER 2MM 7IN #2 BLUE (SUTURE) IMPLANT
TAPE LABRALWHITE 1.5X36 (TAPE) IMPLANT
TAPE SUT LABRALTAP WHT/BLK (SUTURE) IMPLANT
TOWEL OR 17X24 6PK STRL BLUE (TOWEL DISPOSABLE) ×3 IMPLANT
TOWEL OR NON WOVEN STRL DISP B (DISPOSABLE) ×3 IMPLANT
TUBE CONNECTING 20'X1/4 (TUBING) ×1
TUBE CONNECTING 20X1/4 (TUBING) ×2 IMPLANT
TUBING ARTHROSCOPY IRRIG 16FT (MISCELLANEOUS) ×3 IMPLANT
WAND STAR VAC 90 (SURGICAL WAND) ×3 IMPLANT
WATER STERILE IRR 1000ML POUR (IV SOLUTION) ×3 IMPLANT
YANKAUER SUCT BULB TIP NO VENT (SUCTIONS) IMPLANT

## 2014-07-01 NOTE — Discharge Instructions (Signed)
Discharge Instructions after Arthroscopic Shoulder Surgery   A sling has been provided for you. You may remove the sling after 72 hours. The sling may be worn for your protection, if you are in a crowd.  Use ice on the shoulder intermittently over the first 48 hours after surgery.  Pain medication has been prescribed for you.  Use your medication liberally over the first 48 hours, and then begin to taper your use. You may take Extra Strength Tylenol or Tylenol only in place of the pain pills. DO NOT take ANY nonsteroidal anti-inflammatory pain medications: Advil, Motrin, Ibuprofen, Aleve, Naproxen, or Naprosyn.  You may remove your dressing after two days.  You may shower 5 days after surgery. The incision CANNOT get wet prior to 5 days. Simply allow the water to wash over the site and then pat dry. Do not rub the incision. Make sure your axilla (armpit) is completely dry after showering.  Take one aspirin a day for 2 weeks after surgery, unless you have an aspirin sensitivity/allergy or asthma.  Three to 5 times each day you should perform assisted overhead reaching and external rotation (outward turning) exercises with the operative arm. Both exercises should be done with the non-operative arm used as the "therapist arm" while the operative arm remains relaxed. Ten of each exercise should be done three to five times each day.    Overhead reach is helping to lift your stiff arm up as high as it will go. To stretch your overhead reach, lie flat on your back, relax, and grasp the wrist of the tight shoulder with your opposite hand. Using the power in your opposite arm, bring the stiff arm up as far as it is comfortable. Start holding it for ten seconds and then work up to where you can hold it for a count of 30. Breathe slowly and deeply while the arm is moved. Repeat this stretch ten times, trying to help the arm up a little higher each time.       External rotation is turning the arm out to  the side while your elbow stays close to your body. External rotation is best stretched while you are lying on your back. Hold a cane, yardstick, broom handle, or dowel in both hands. Bend both elbows to a right angle. Use steady, gentle force from your normal arm to rotate the hand of the stiff shoulder out away from your body. Continue the rotation as far as it will go comfortably, holding it there for a count of 10. Repeat this exercise ten times.     Please call [phone] during normal business hours or [phone] after hours for any problems. Including the following:  - excessive redness of the incisions - drainage for more than 4 days - fever of more than 101.5 F  *Please note that pain medications will not be refilled after hours or on weekends.     Post Anesthesia Home Care Instructions  Activity: Get plenty of rest for the remainder of the day. A responsible adult should stay with you for 24 hours following the procedure.  For the next 24 hours, DO NOT: -Drive a car -Paediatric nurse -Drink alcoholic beverages -Take any medication unless instructed by your physician -Make any legal decisions or sign important papers.  Meals: Start with liquid foods such as gelatin or soup. Progress to regular foods as tolerated. Avoid greasy, spicy, heavy foods. If nausea and/or vomiting occur, drink only clear liquids until the nausea and/or vomiting subsides.  Call your physician if vomiting continues.  Special Instructions/Symptoms: Your throat may feel dry or sore from the anesthesia or the breathing tube placed in your throat during surgery. If this causes discomfort, gargle with warm salt water. The discomfort should disappear within 24 hours.

## 2014-07-01 NOTE — Anesthesia Procedure Notes (Addendum)
Anesthesia Regional Block:  Interscalene brachial plexus block  Pre-Anesthetic Checklist: ,, timeout performed, Correct Patient, Correct Site, Correct Laterality, Correct Procedure, Correct Position, site marked, Risks and benefits discussed,  Surgical consent,  Pre-op evaluation,  At surgeon's request and post-op pain management  Laterality: Right and Upper  Prep: chloraprep       Needles:  Injection technique: Single-shot  Needle Type: Echogenic Needle     Needle Length: 5cm 5 cm Needle Gauge: 21 and 21 G    Additional Needles:  Procedures: ultrasound guided (picture in chart) Interscalene brachial plexus block Narrative:  Start time: 07/01/2014 10:47 AM End time: 07/01/2014 10:52 AM Injection made incrementally with aspirations every 5 mL.  Performed by: Personally  Anesthesiologist: CREWS, DAVID A   Procedure Name: Intubation Date/Time: 07/01/2014 11:43 AM Performed by: Melynda Ripple D Pre-anesthesia Checklist: Patient identified, Emergency Drugs available, Suction available and Patient being monitored Patient Re-evaluated:Patient Re-evaluated prior to inductionOxygen Delivery Method: Circle System Utilized Preoxygenation: Pre-oxygenation with 100% oxygen Intubation Type: IV induction Ventilation: Mask ventilation without difficulty Laryngoscope Size: Miller and 2 Grade View: Grade I Tube type: Oral Number of attempts: 1 Airway Equipment and Method: Stylet and Oral airway Placement Confirmation: ETT inserted through vocal cords under direct vision,  positive ETCO2 and breath sounds checked- equal and bilateral Secured at: 22 cm Tube secured with: Tape Dental Injury: Teeth and Oropharynx as per pre-operative assessment

## 2014-07-01 NOTE — Op Note (Signed)
Procedure(s):   DAYQUAN BUYS male 64 y.o. 07/01/2014  Procedure(s) and Anesthesia Type:    #1 right shoulder arthroscopic debridement extensive labral tears, glenohumeral chondromalacia, partial-thickness rotator cuff tears #2 right shoulder arthroscopic subacromial decompression/bursectomy   Surgeon(s) and Role:    * Nita Sells, MD - Primary     Surgeon: Nita Sells   Assistants: Jeanmarie Hubert PA-C [facility] was present and scrubbed throughout the procedure and was essential in positioning, assisting with the camera and instrumentation,, and closure)  Anesthesia: General endotracheal anesthesia with preoperative interscalene block given by the attending anesthesiologist   Procedure Detail   Estimated Blood Loss: Min         Drains: none  Blood Given: none         Specimens: none        Complications:  * No complications entered in OR log *         Disposition: PACU - hemodynamically stable.         Condition: stable    Procedure:   INDICATIONS FOR SURGERY: The patient is 64 y.o. male who has had a long history of right shoulder pain which has been refractory to nonoperative management. He did have temporary very good relief of his pain with subacromial injection but the pain returned. MRI revealed some partial-thickness rotator cuff tearing but no full tear. Ultimately he was indicated for surgery to try and decrease pain and restore function. He was noted to have some labral cysts on his MRI, however do not felt to be directly related to his symptoms.  OPERATIVE FINDINGS: Examination under anesthesia: No stiffness or instability  DESCRIPTION OF PROCEDURE: The patient was identified in preoperative  holding area where I personally marked the operative site after  verifying site, side, and procedure with the patient. An interscalene block was given by the attending anesthesiologist the holding area.  The patient was taken back to  the operating room where general anesthesia was induced without complication and was placed in the beach-chair position with the back  elevated about 60 degrees and all extremities and head and neck carefully padded and  positioned.   The right upper extremity was then prepped and  draped in a standard sterile fashion. The appropriate time-out  procedure was carried out. The patient did receive IV antibiotics  within 30 minutes of incision.   A small posterior portal incision was made and the arthroscope was introduced into the joint. An anterior portal was then established above the subscapularis using needle localization. Small cannula was placed anteriorly. Diagnostic arthroscopy was then carried out.  He was noted to have extensive tearing of the labrum circumferentially. This was extensively debrided with the shaver back to healthy labrum. The labrum did not have complete detachment superiorly or posteriorly. After extensive debridement the remaining labrum was stable. The undersurface was probed. There was no definite detachment area where fluid could be leaking into the cysts. The proximal long head biceps tendon was intact. He was noted to have significant 20% partial tearing of the articular side of the supraspinatus which was aggressively debrided back to good healthy bleeding tendon. The inner surface of the subscapularis was also partially torn and debrided back to good healthy tendon. Given the associated rotator cuff pathology which went along with his symptoms and response to treatment, I did not feel that an aggressive attempt at going after the per labral cyst was warranted, as this would then require formal labral repair and a longer recovery.  I felt that this was not necessary in his case. Glenohumeral joint surfaces were examined he was noted to have some grade 3 changes and inferior*the glenoid and on the humeral head with small areas of grade 4 exposed bone. This is debrided with a  shaver to remove any loose or unstable chondral flaps.  The arthroscope was then introduced into the subacromial space a standard lateral portal was established with needle localization. The shaver was used through the lateral portal to perform extensive bursectomy. Coracoacromial ligament was examined and found to be frayed indicating chronic impingement.  The bursal surface of the rotator cuff was examined and found to be completely intact.  The coracoacromial ligament was taken down off the anterior acromion with the ArthroCare exposing a moderate hooked anterior acromial spur. A high-speed bur was then used through the lateral portal to take down the anterior acromial spur from lateral to medial in a standard acromioplasty.  The acromioplasty was also viewed from the lateral portal and the bur was used as necessary to ensure that the acromion was completely flat from posterior to anterior.   The arthroscopic equipment was removed from the joint and the portals were closed with 3-0 nylon in an interrupted fashion. Sterile dressings were then applied including Xeroform 4 x 4's ABDs and tape. The patient was then allowed to awaken from general anesthesia, placed in a sling, transferred to the stretcher and taken to the recovery room in stable condition.   POSTOPERATIVE PLAN: The patient will be discharged home today and will followup in one week for suture removal and wound check.  He will be able to get into physical therapy right away.

## 2014-07-01 NOTE — Anesthesia Postprocedure Evaluation (Signed)
  Anesthesia Post-op Note  Patient: Kevin Aguirre  Procedure(s) Performed: Procedure(s): SHOULDER ARTHROSCOPY WITH SUBACROMIAL DECOMPRESSION, DEBRIDEMENT LABRUM, CHONDROMALACIA, DEBRIDEMENT ROTATOR CUFF TEAR (Right)  Patient Location: PACU  Anesthesia Type: General  With regional block  Level of Consciousness: awake, alert  and oriented  Airway and Oxygen Therapy: Patient Spontanous Breathing  Post-op Pain: none  Post-op Assessment: Post-op Vital signs reviewed  Post-op Vital Signs: Reviewed  Last Vitals:  Filed Vitals:   07/01/14 1345  BP: 132/78  Pulse: 82  Temp: 36.4 C  Resp: 18    Complications: No apparent anesthesia complications

## 2014-07-01 NOTE — H&P (Signed)
Kevin Aguirre is an 64 y.o. male.   Chief Complaint: R shoulder pain  HPI: R shoulder pain and dysfunction with RC tendinosis/impingement and paralabral cyst formation, failed nonop tx.  Past Medical History  Diagnosis Date  . Hypertension   . Hyperlipidemia   . GERD (gastroesophageal reflux disease)   . Wears glasses   . History of bronchitis     Past Surgical History  Procedure Laterality Date  . Tonsillectomy    . Colonoscopy      Family History  Problem Relation Age of Onset  . Arthritis Mother   . Hyperlipidemia Mother   . Hypertension Mother   . Heart disease Neg Hx   . Cancer Neg Hx   . Early death Neg Hx   . Alcohol abuse Neg Hx   . Diabetes Neg Hx   . Kidney disease Neg Hx   . Stroke Neg Hx    Social History:  reports that he quit smoking about 12 years ago. His smoking use included Cigarettes. He has a 13.5 pack-year smoking history. He has never used smokeless tobacco. He reports that he drinks about 1.8 oz of alcohol per week. He reports that he does not use illicit drugs.  Allergies:  Allergies  Allergen Reactions  . Lisinopril     cough  . Erythromycin Hives and Itching  . Doxycycline Rash    Medications Prior to Admission  Medication Sig Dispense Refill  . aspirin EC 81 MG tablet Take 81 mg by mouth at bedtime.    Marland Kitchen atorvastatin (LIPITOR) 10 MG tablet TAKE 1 TABLET DAILY 90 tablet 2  . cetirizine (ZYRTEC) 10 MG tablet Take 10 mg by mouth at bedtime.    Marland Kitchen esomeprazole (NEXIUM) 40 MG capsule TAKE 1 CAPSULE DAILY BEFORE BREAKFAST 90 capsule 2  . Glucosamine-Chondroitin-MSM 500-400-125 MG TABS Take by mouth. Pt taking every other day    . mometasone (NASONEX) 50 MCG/ACT nasal spray Place 2 sprays into the nose daily.    . Multiple Vitamin (ONE-A-DAY MENS PO) Take by mouth.    . olmesartan (BENICAR) 20 MG tablet TAKE 1 TABLET DAILY 90 tablet 2  . sodium chloride (OCEAN) 0.65 % SOLN nasal spray Place 1 spray into both nostrils at bedtime as needed  for congestion.    . Zinc 30 MG CAPS Take by mouth. Every other day    . zolpidem (AMBIEN) 10 MG tablet Take 1 tablet (10 mg total) by mouth at bedtime as needed. For sleep. 90 tablet 1  . albuterol (PROVENTIL HFA;VENTOLIN HFA) 108 (90 BASE) MCG/ACT inhaler Inhale into the lungs every 6 (six) hours as needed for wheezing or shortness of breath.    Marland Kitchen ibuprofen (ADVIL,MOTRIN) 600 MG tablet Take 1 tablet (600 mg total) by mouth every 8 (eight) hours as needed. 90 tablet 2  . simethicone (MYLICON) 80 MG chewable tablet Chew 80 mg by mouth every 6 (six) hours as needed. For gas.      No results found for this or any previous visit (from the past 48 hour(s)). No results found.  Review of Systems  All other systems reviewed and are negative.   Blood pressure 137/68, pulse 84, temperature 98.3 F (36.8 C), temperature source Oral, resp. rate 19, height 5\' 11"  (1.803 m), weight 89.359 kg (197 lb), SpO2 99 %. Physical Exam  Constitutional: He is oriented to person, place, and time. He appears well-developed and well-nourished.  HENT:  Head: Atraumatic.  Eyes: EOM are normal.  Cardiovascular: Intact  distal pulses.   Respiratory: Effort normal.  Musculoskeletal:  R shoulder pain with impingement testing  Neurological: He is alert and oriented to person, place, and time.  Skin: Skin is warm and dry.  Psychiatric: He has a normal mood and affect.     Assessment/Plan Plan R arth SAD/debridement, cyst decompression, pos labral repair. Risks / benefits of surgery discussed Consent on chart  NPO for OR Preop antibiotics   Brandom Kerwin WILLIAM 07/01/2014, 11:32 AM

## 2014-07-01 NOTE — Transfer of Care (Signed)
Immediate Anesthesia Transfer of Care Note  Patient: [name]  Procedure(s) Performed: Procedure(s): RIGHT SHOULDER ARTHROSCOPY WITH ROTATOR CUFF DEBRIDEMENT, SUBACROMIAL DECOMPRESSION, CYST DEBRIDEMENT, LABRAL REPAIR, BICEPS TENOTOMY (Right)  Patient Location: PACU  Anesthesia Type:General and Regional  Level of Consciousness: sedated  Airway & Oxygen Therapy: Patient Spontanous Breathing and Patient connected to face mask oxygen  Post-op Assessment: Report given to RN and Post -op Vital signs reviewed and stable  Post vital signs: Reviewed and stable  Last Vitals:  Filed Vitals:   07/01/14 1100  BP: 137/68  Pulse: 84  Temp:   Resp: 19    Complications: No apparent anesthesia complications

## 2014-07-01 NOTE — Anesthesia Preprocedure Evaluation (Addendum)
Anesthesia Evaluation  Patient identified by MRN, date of birth, ID band Patient awake    Reviewed: Allergy & Precautions, NPO status , Patient's Chart, lab work & pertinent test results  Airway Mallampati: I  TM Distance: >3 FB Neck ROM: Full    Dental  (+) Teeth Intact, Dental Advisory Given   Pulmonary former smoker,  breath sounds clear to auscultation        Cardiovascular hypertension, Pt. on medications Rhythm:Regular Rate:Normal     Neuro/Psych    GI/Hepatic   Endo/Other    Renal/GU      Musculoskeletal   Abdominal   Peds  Hematology   Anesthesia Other Findings   Reproductive/Obstetrics                            Anesthesia Physical Anesthesia Plan  ASA: II  Anesthesia Plan: General   Post-op Pain Management:    Induction: Intravenous  Airway Management Planned: Oral ETT  Additional Equipment:   Intra-op Plan:   Post-operative Plan: Extubation in OR  Informed Consent: I have reviewed the patients History and Physical, chart, labs and discussed the procedure including the risks, benefits and alternatives for the proposed anesthesia with the patient or authorized representative who has indicated his/her understanding and acceptance.   Dental advisory given  Plan Discussed with: Anesthesiologist, CRNA and Surgeon  Anesthesia Plan Comments:         Anesthesia Quick Evaluation

## 2014-07-02 ENCOUNTER — Encounter (HOSPITAL_BASED_OUTPATIENT_CLINIC_OR_DEPARTMENT_OTHER): Payer: Self-pay | Admitting: Orthopedic Surgery

## 2014-07-02 LAB — POCT HEMOGLOBIN-HEMACUE: Hemoglobin: 15.8 g/dL (ref 13.0–17.0)

## 2014-07-10 DIAGNOSIS — Z9889 Other specified postprocedural states: Secondary | ICD-10-CM | POA: Diagnosis not present

## 2014-07-15 DIAGNOSIS — M24111 Other articular cartilage disorders, right shoulder: Secondary | ICD-10-CM | POA: Diagnosis not present

## 2014-07-15 DIAGNOSIS — Z9889 Other specified postprocedural states: Secondary | ICD-10-CM | POA: Diagnosis not present

## 2014-07-15 DIAGNOSIS — M25511 Pain in right shoulder: Secondary | ICD-10-CM | POA: Diagnosis not present

## 2014-07-19 DIAGNOSIS — Z9889 Other specified postprocedural states: Secondary | ICD-10-CM | POA: Diagnosis not present

## 2014-07-19 DIAGNOSIS — M24111 Other articular cartilage disorders, right shoulder: Secondary | ICD-10-CM | POA: Diagnosis not present

## 2014-07-19 DIAGNOSIS — M25511 Pain in right shoulder: Secondary | ICD-10-CM | POA: Diagnosis not present

## 2014-07-22 DIAGNOSIS — M24111 Other articular cartilage disorders, right shoulder: Secondary | ICD-10-CM | POA: Diagnosis not present

## 2014-07-22 DIAGNOSIS — M25511 Pain in right shoulder: Secondary | ICD-10-CM | POA: Diagnosis not present

## 2014-07-22 DIAGNOSIS — Z9889 Other specified postprocedural states: Secondary | ICD-10-CM | POA: Diagnosis not present

## 2014-07-24 DIAGNOSIS — M24111 Other articular cartilage disorders, right shoulder: Secondary | ICD-10-CM | POA: Diagnosis not present

## 2014-07-24 DIAGNOSIS — M25511 Pain in right shoulder: Secondary | ICD-10-CM | POA: Diagnosis not present

## 2014-07-24 DIAGNOSIS — Z9889 Other specified postprocedural states: Secondary | ICD-10-CM | POA: Diagnosis not present

## 2014-07-29 DIAGNOSIS — M25511 Pain in right shoulder: Secondary | ICD-10-CM | POA: Diagnosis not present

## 2014-07-29 DIAGNOSIS — M24111 Other articular cartilage disorders, right shoulder: Secondary | ICD-10-CM | POA: Diagnosis not present

## 2014-07-29 DIAGNOSIS — Z9889 Other specified postprocedural states: Secondary | ICD-10-CM | POA: Diagnosis not present

## 2014-07-31 DIAGNOSIS — Z9889 Other specified postprocedural states: Secondary | ICD-10-CM | POA: Diagnosis not present

## 2014-07-31 DIAGNOSIS — M24111 Other articular cartilage disorders, right shoulder: Secondary | ICD-10-CM | POA: Diagnosis not present

## 2014-07-31 DIAGNOSIS — M25511 Pain in right shoulder: Secondary | ICD-10-CM | POA: Diagnosis not present

## 2014-08-05 DIAGNOSIS — Z9889 Other specified postprocedural states: Secondary | ICD-10-CM | POA: Diagnosis not present

## 2014-08-05 DIAGNOSIS — M25511 Pain in right shoulder: Secondary | ICD-10-CM | POA: Diagnosis not present

## 2014-08-05 DIAGNOSIS — M24111 Other articular cartilage disorders, right shoulder: Secondary | ICD-10-CM | POA: Diagnosis not present

## 2014-08-07 DIAGNOSIS — Z9889 Other specified postprocedural states: Secondary | ICD-10-CM | POA: Diagnosis not present

## 2014-08-07 DIAGNOSIS — M25511 Pain in right shoulder: Secondary | ICD-10-CM | POA: Diagnosis not present

## 2014-08-07 DIAGNOSIS — M24111 Other articular cartilage disorders, right shoulder: Secondary | ICD-10-CM | POA: Diagnosis not present

## 2014-08-13 DIAGNOSIS — M25511 Pain in right shoulder: Secondary | ICD-10-CM | POA: Diagnosis not present

## 2014-08-13 DIAGNOSIS — M24111 Other articular cartilage disorders, right shoulder: Secondary | ICD-10-CM | POA: Diagnosis not present

## 2014-08-13 DIAGNOSIS — Z9889 Other specified postprocedural states: Secondary | ICD-10-CM | POA: Diagnosis not present

## 2014-08-15 DIAGNOSIS — Z9889 Other specified postprocedural states: Secondary | ICD-10-CM | POA: Diagnosis not present

## 2014-08-15 DIAGNOSIS — M24111 Other articular cartilage disorders, right shoulder: Secondary | ICD-10-CM | POA: Diagnosis not present

## 2014-08-15 DIAGNOSIS — M25511 Pain in right shoulder: Secondary | ICD-10-CM | POA: Diagnosis not present

## 2014-08-21 DIAGNOSIS — Z9889 Other specified postprocedural states: Secondary | ICD-10-CM | POA: Diagnosis not present

## 2014-08-21 DIAGNOSIS — M24111 Other articular cartilage disorders, right shoulder: Secondary | ICD-10-CM | POA: Diagnosis not present

## 2014-08-21 DIAGNOSIS — M25511 Pain in right shoulder: Secondary | ICD-10-CM | POA: Diagnosis not present

## 2014-08-23 DIAGNOSIS — M25511 Pain in right shoulder: Secondary | ICD-10-CM | POA: Diagnosis not present

## 2014-08-23 DIAGNOSIS — M24111 Other articular cartilage disorders, right shoulder: Secondary | ICD-10-CM | POA: Diagnosis not present

## 2014-08-23 DIAGNOSIS — Z9889 Other specified postprocedural states: Secondary | ICD-10-CM | POA: Diagnosis not present

## 2014-08-28 DIAGNOSIS — M24111 Other articular cartilage disorders, right shoulder: Secondary | ICD-10-CM | POA: Diagnosis not present

## 2014-08-28 DIAGNOSIS — M25511 Pain in right shoulder: Secondary | ICD-10-CM | POA: Diagnosis not present

## 2014-08-28 DIAGNOSIS — Z9889 Other specified postprocedural states: Secondary | ICD-10-CM | POA: Diagnosis not present

## 2014-08-30 DIAGNOSIS — Z9889 Other specified postprocedural states: Secondary | ICD-10-CM | POA: Diagnosis not present

## 2014-08-30 DIAGNOSIS — M24111 Other articular cartilage disorders, right shoulder: Secondary | ICD-10-CM | POA: Diagnosis not present

## 2014-08-30 DIAGNOSIS — M25511 Pain in right shoulder: Secondary | ICD-10-CM | POA: Diagnosis not present

## 2014-09-04 DIAGNOSIS — Z9889 Other specified postprocedural states: Secondary | ICD-10-CM | POA: Diagnosis not present

## 2014-09-04 DIAGNOSIS — M25511 Pain in right shoulder: Secondary | ICD-10-CM | POA: Diagnosis not present

## 2014-09-04 DIAGNOSIS — M24111 Other articular cartilage disorders, right shoulder: Secondary | ICD-10-CM | POA: Diagnosis not present

## 2014-09-06 DIAGNOSIS — M25511 Pain in right shoulder: Secondary | ICD-10-CM | POA: Diagnosis not present

## 2014-09-06 DIAGNOSIS — Z9889 Other specified postprocedural states: Secondary | ICD-10-CM | POA: Diagnosis not present

## 2014-09-06 DIAGNOSIS — M24111 Other articular cartilage disorders, right shoulder: Secondary | ICD-10-CM | POA: Diagnosis not present

## 2014-09-18 DIAGNOSIS — Z9889 Other specified postprocedural states: Secondary | ICD-10-CM | POA: Diagnosis not present

## 2014-12-17 IMAGING — CR DG CHEST 2V
2 series · 2 of 2 positions shown · non-contrast
Comparison: 08/16/2011

CLINICAL DATA: Cough

CHEST - 2 VIEW

[view not recorded (1 of 2)]
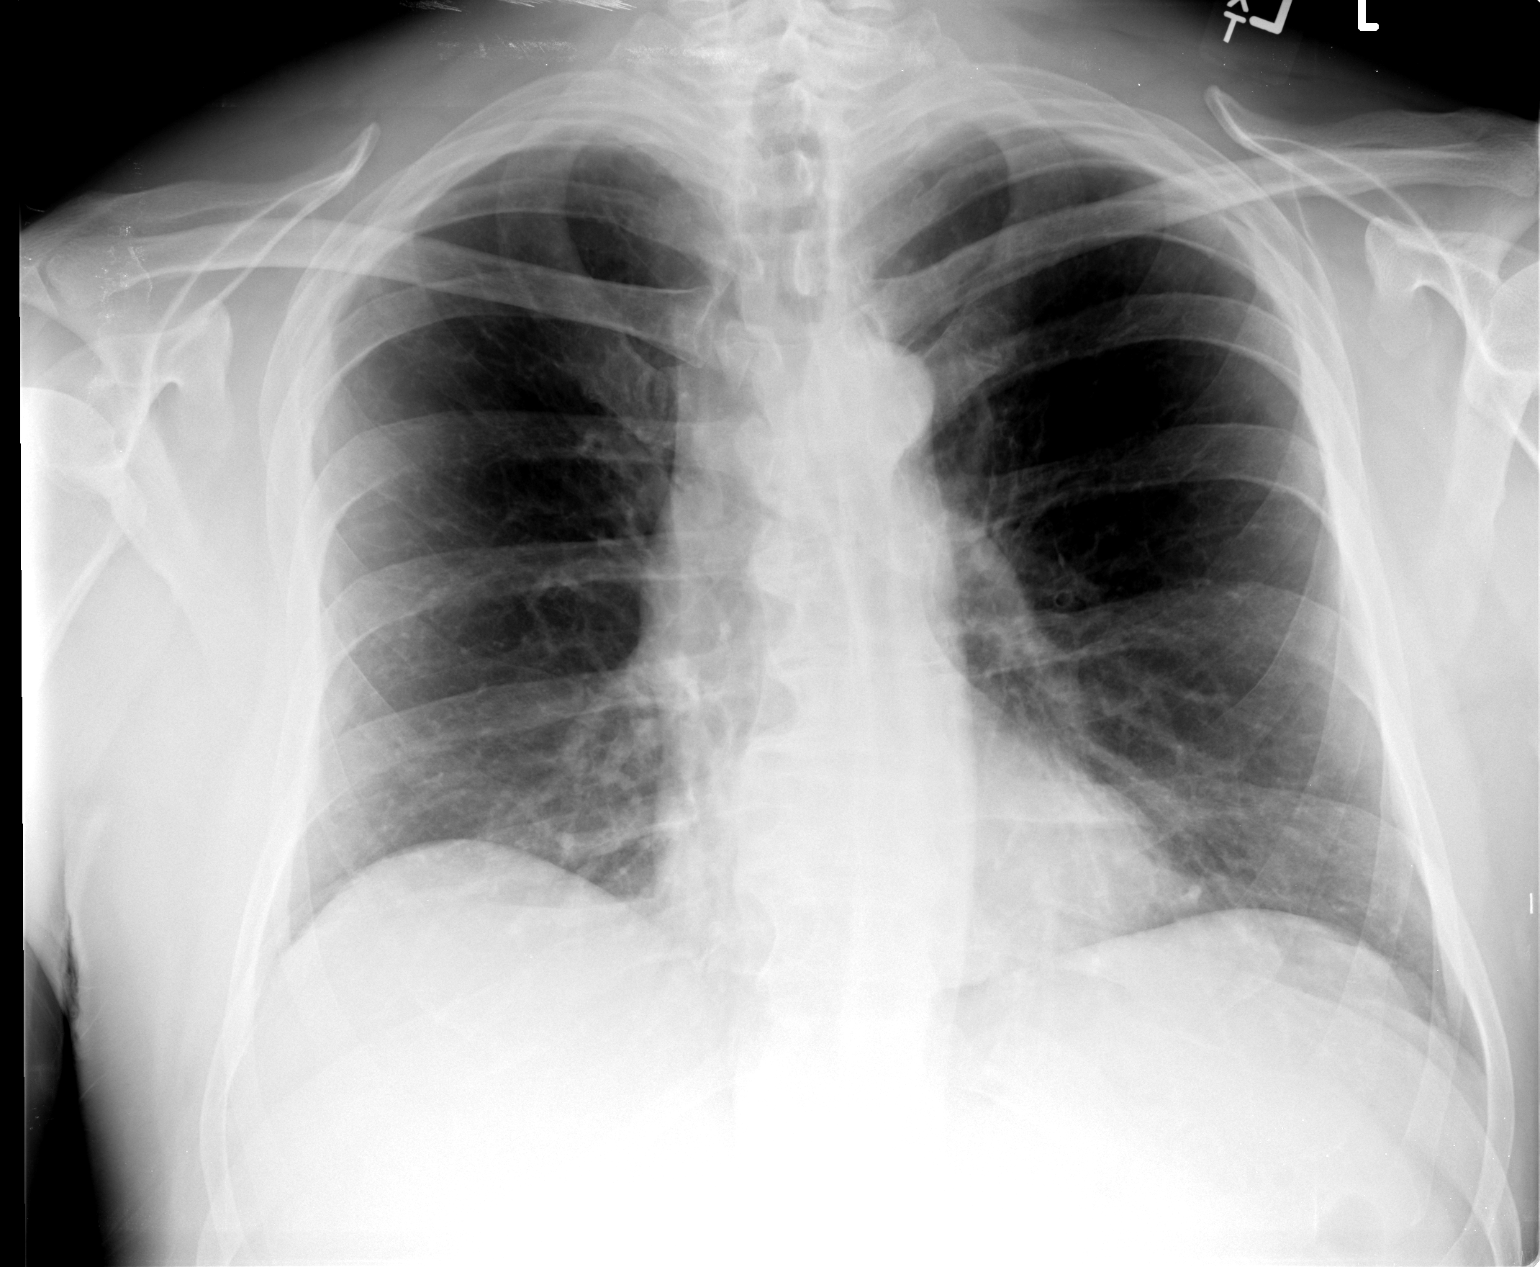

[view not recorded (2 of 2)]
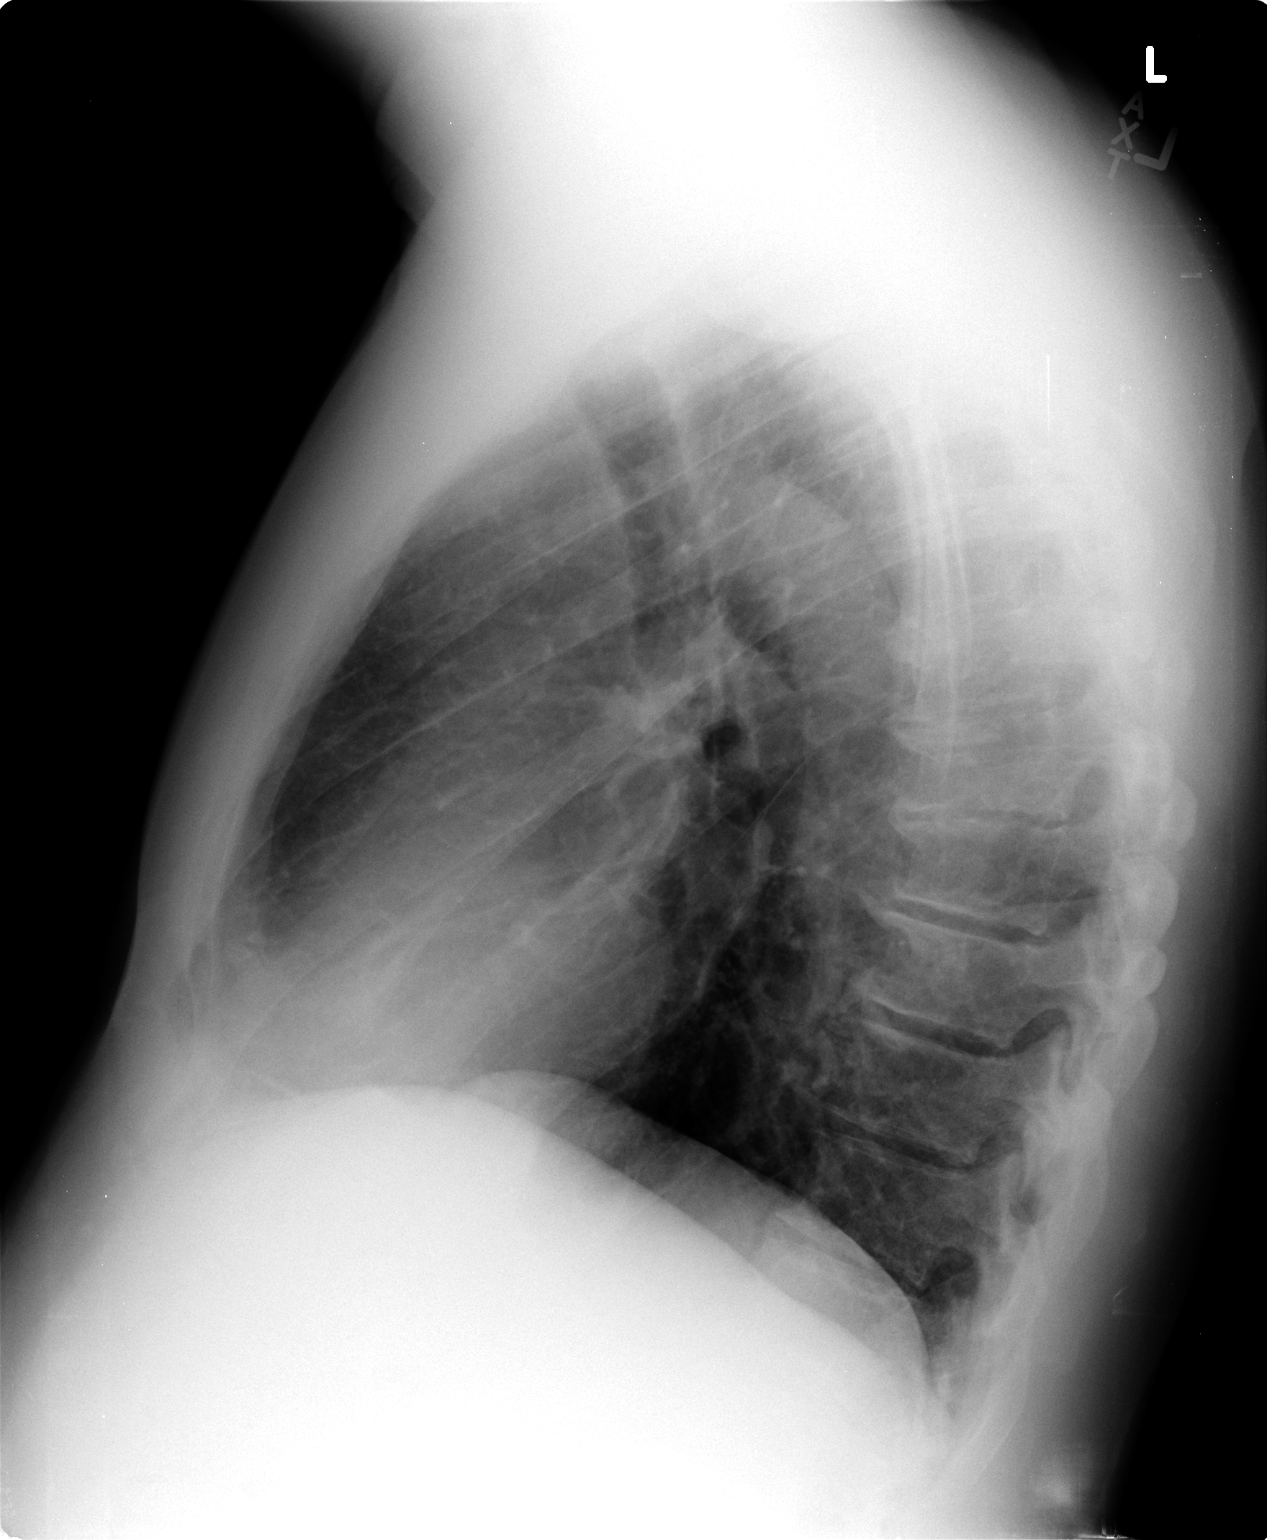

[2 of 2 positions shown; findings below may reference images not displayed]

FINDINGS: Normal heart size.   Clear lungs.  No pleural effusion.
No pneumothorax.
IMPRESSION: No active cardiopulmonary disease.

## 2014-12-26 ENCOUNTER — Other Ambulatory Visit: Payer: Self-pay | Admitting: Internal Medicine

## 2015-03-04 DIAGNOSIS — Z23 Encounter for immunization: Secondary | ICD-10-CM | POA: Diagnosis not present

## 2015-03-20 ENCOUNTER — Other Ambulatory Visit: Payer: Self-pay | Admitting: Internal Medicine

## 2015-03-26 DIAGNOSIS — E7800 Pure hypercholesterolemia, unspecified: Secondary | ICD-10-CM | POA: Diagnosis not present

## 2015-03-26 DIAGNOSIS — J309 Allergic rhinitis, unspecified: Secondary | ICD-10-CM | POA: Diagnosis not present

## 2015-03-26 DIAGNOSIS — K219 Gastro-esophageal reflux disease without esophagitis: Secondary | ICD-10-CM | POA: Diagnosis not present

## 2015-03-26 DIAGNOSIS — I10 Essential (primary) hypertension: Secondary | ICD-10-CM | POA: Diagnosis not present

## 2015-06-26 DIAGNOSIS — M79673 Pain in unspecified foot: Secondary | ICD-10-CM | POA: Diagnosis not present

## 2015-06-26 DIAGNOSIS — E7800 Pure hypercholesterolemia, unspecified: Secondary | ICD-10-CM | POA: Diagnosis not present

## 2015-06-26 DIAGNOSIS — I10 Essential (primary) hypertension: Secondary | ICD-10-CM | POA: Diagnosis not present

## 2015-06-27 DIAGNOSIS — M79673 Pain in unspecified foot: Secondary | ICD-10-CM | POA: Diagnosis not present

## 2015-06-27 DIAGNOSIS — E7800 Pure hypercholesterolemia, unspecified: Secondary | ICD-10-CM | POA: Diagnosis not present

## 2015-06-27 DIAGNOSIS — I10 Essential (primary) hypertension: Secondary | ICD-10-CM | POA: Diagnosis not present

## 2015-08-07 DIAGNOSIS — I10 Essential (primary) hypertension: Secondary | ICD-10-CM | POA: Diagnosis not present

## 2015-10-06 IMAGING — CR DG HAND COMPLETE 3+V*R*
3 series · 3 of 3 positions shown · non-contrast
Comparison: None.

CLINICAL DATA: Right hand pain

EXAM:
RIGHT HAND - COMPLETE 3+ VIEW

[view not recorded (1 of 3)]
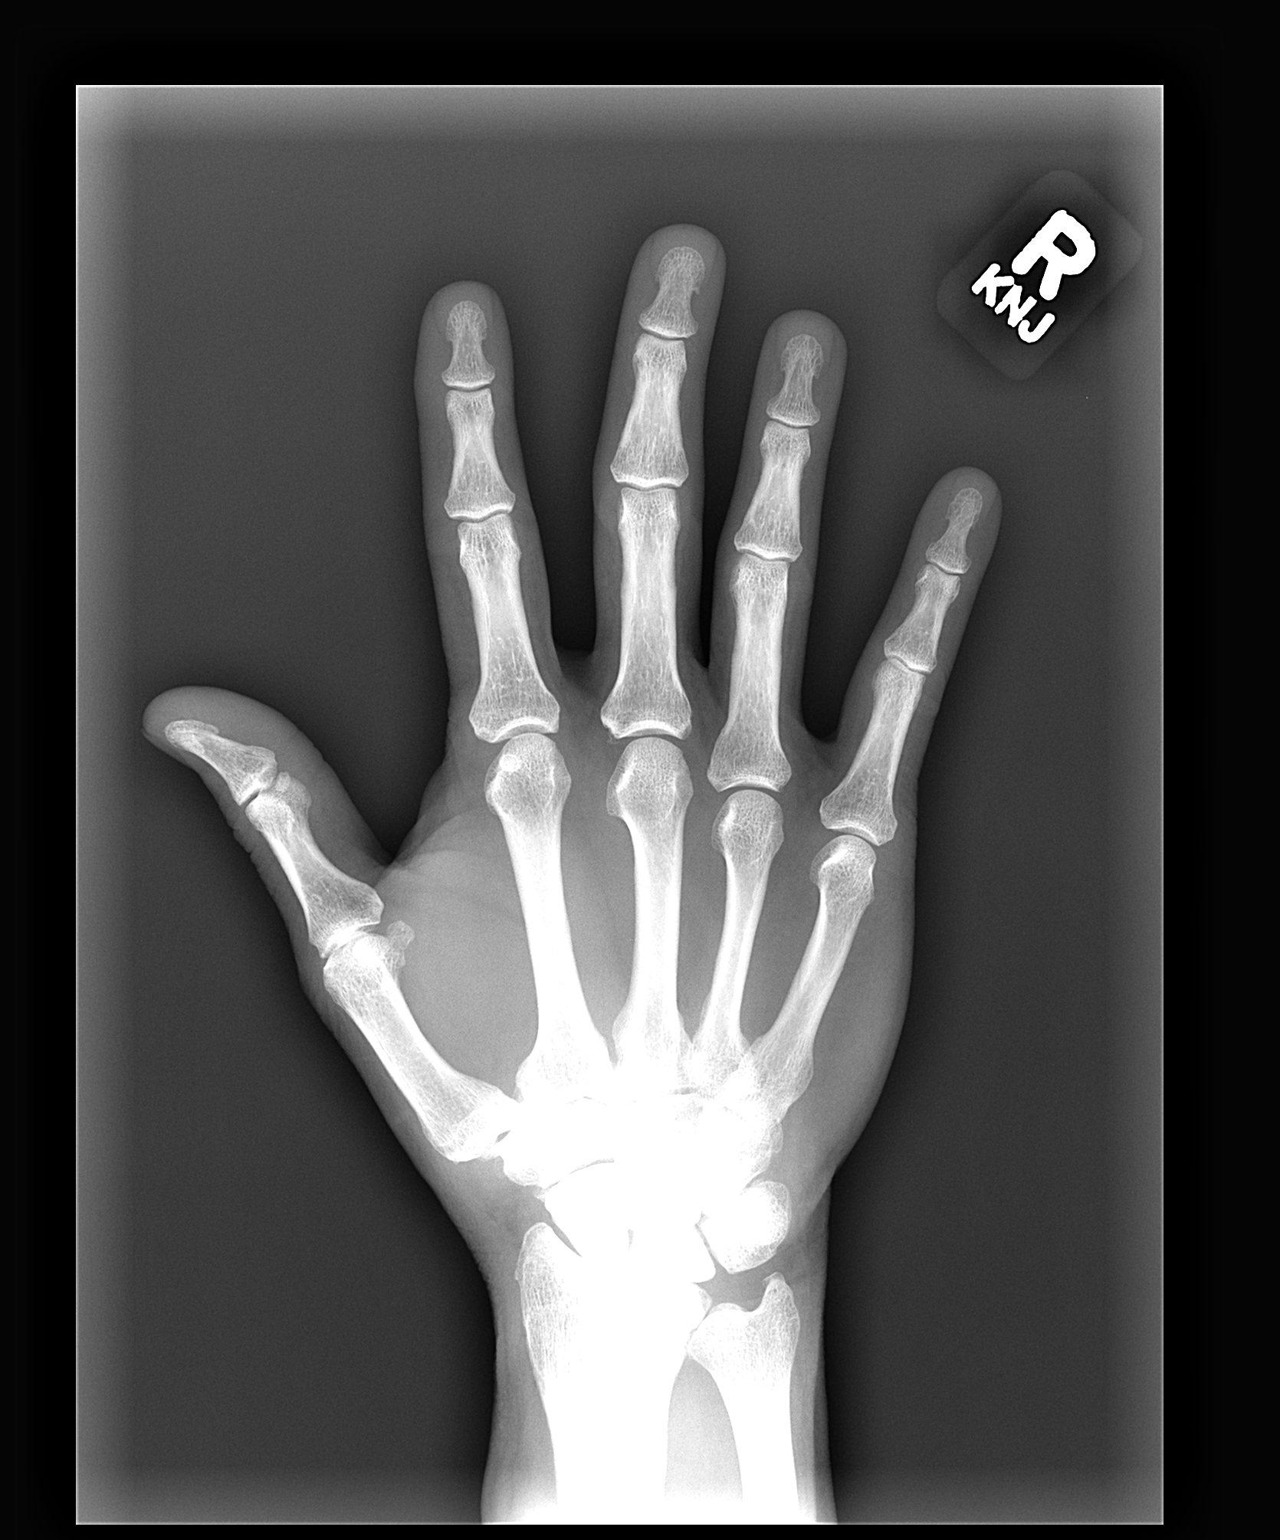

[view not recorded (2 of 3)]
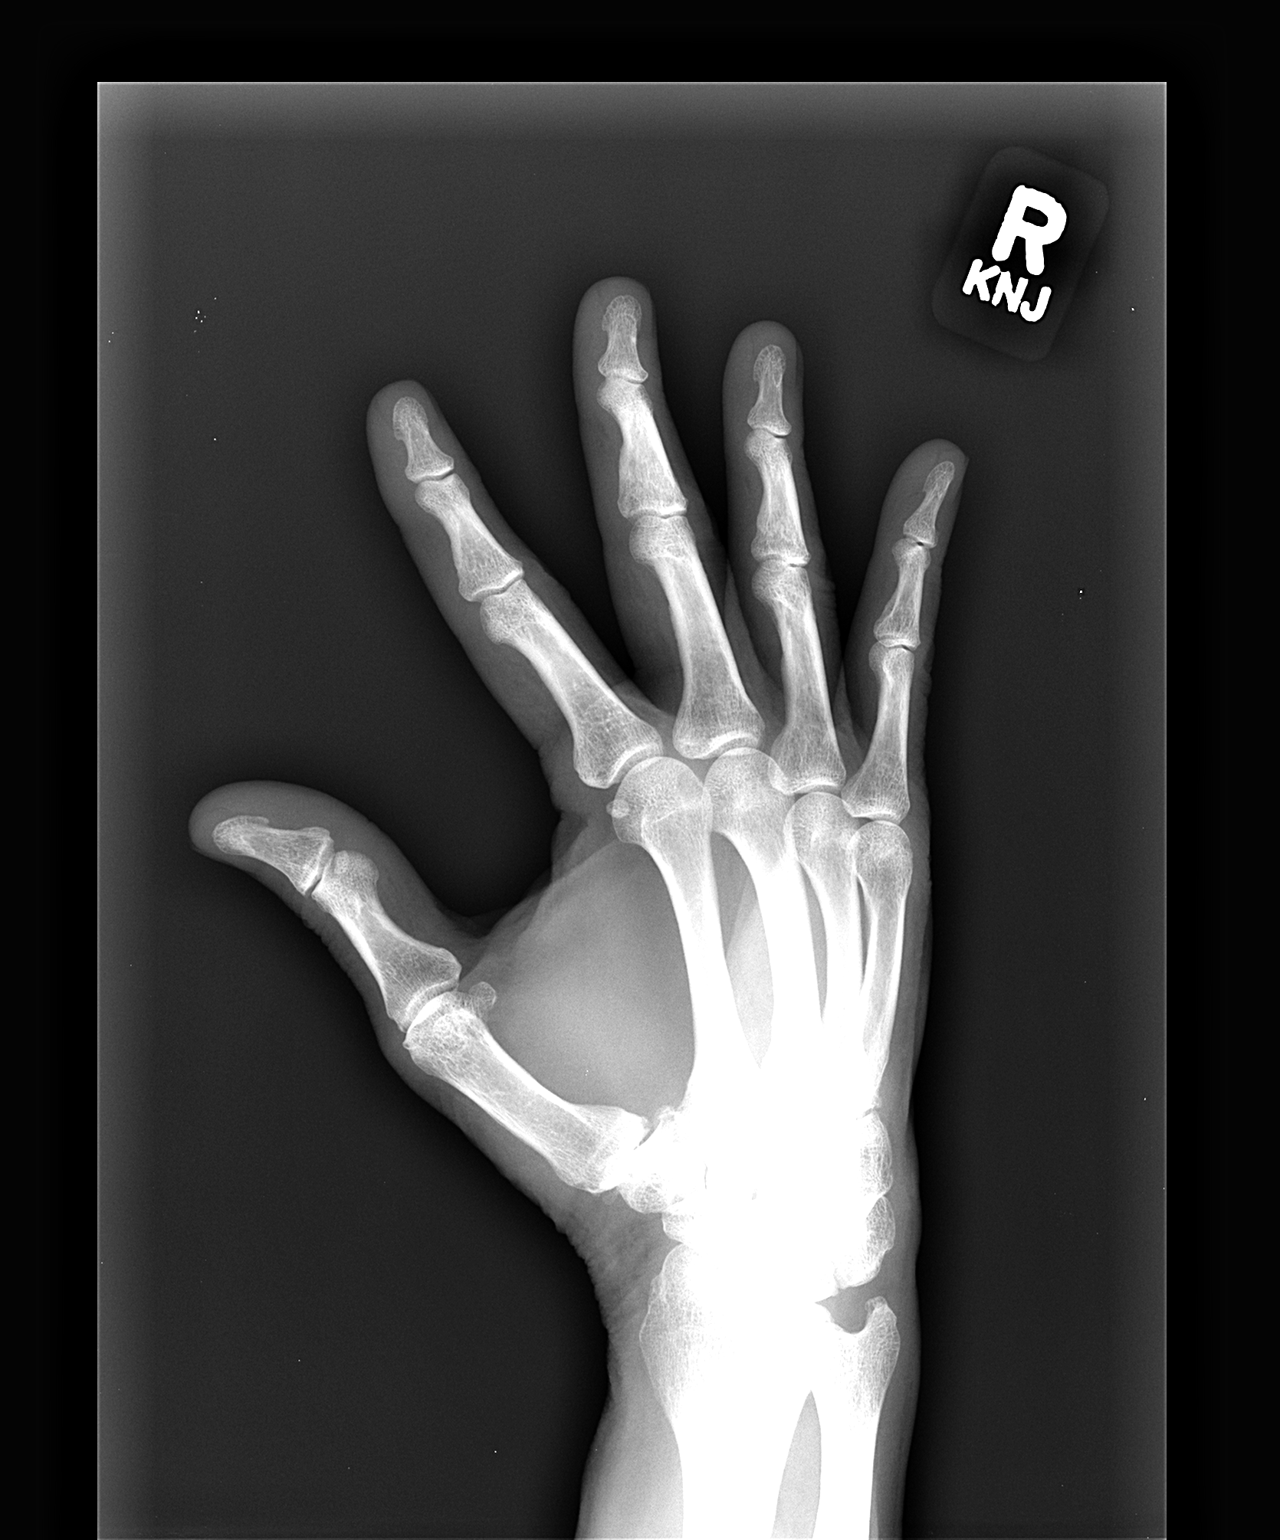

[view not recorded (3 of 3)]
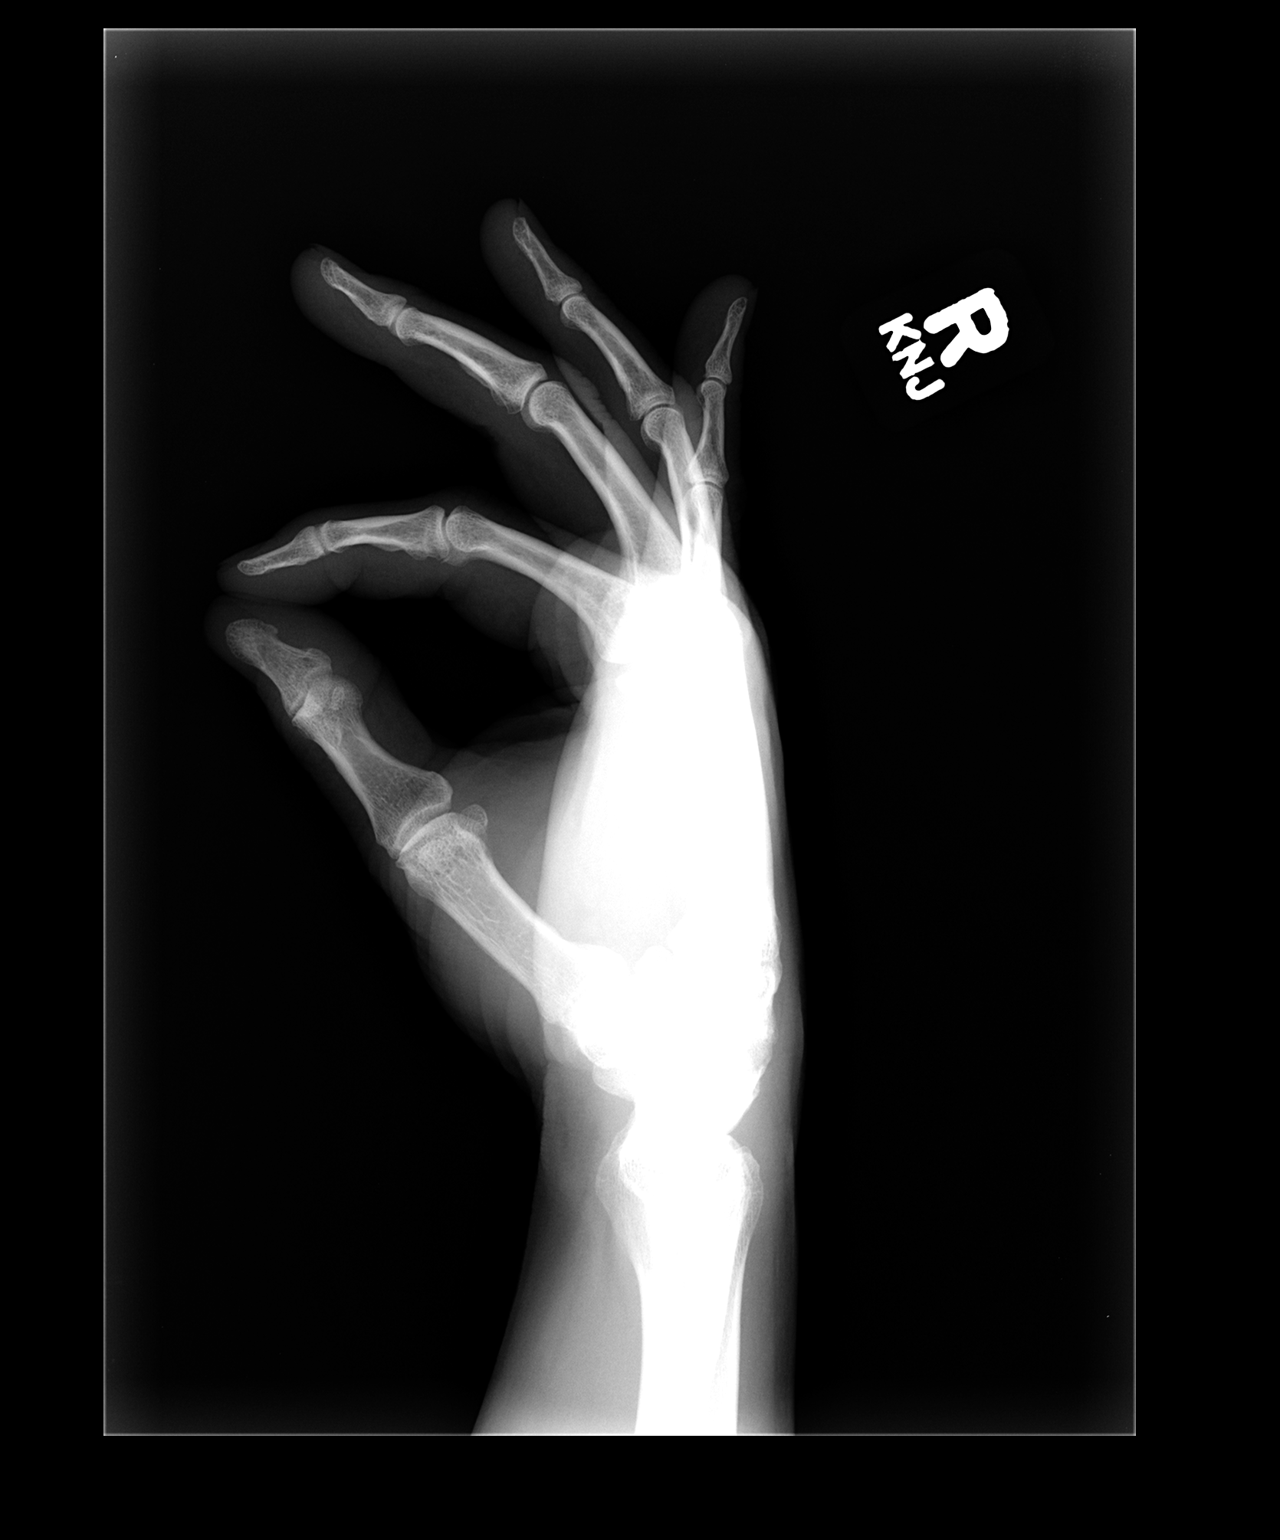

[3 of 3 positions shown; findings below may reference images not displayed]

FINDINGS: Normal alignment no fracture. Mild degenerative change at the base
of thumb. No erosion identified.
IMPRESSION: Mild osteoarthritis of the base of thumb.  No acute abnormality.

## 2015-12-02 DIAGNOSIS — I10 Essential (primary) hypertension: Secondary | ICD-10-CM | POA: Diagnosis not present

## 2015-12-02 DIAGNOSIS — Z1159 Encounter for screening for other viral diseases: Secondary | ICD-10-CM | POA: Diagnosis not present

## 2015-12-02 DIAGNOSIS — E7800 Pure hypercholesterolemia, unspecified: Secondary | ICD-10-CM | POA: Diagnosis not present

## 2016-03-08 DIAGNOSIS — Z23 Encounter for immunization: Secondary | ICD-10-CM | POA: Diagnosis not present

## 2016-07-15 ENCOUNTER — Telehealth: Payer: Self-pay

## 2016-07-15 NOTE — Telephone Encounter (Signed)
Sent notes to scheduling 

## 2016-07-21 ENCOUNTER — Other Ambulatory Visit: Payer: Self-pay | Admitting: Family Medicine

## 2016-07-21 DIAGNOSIS — I341 Nonrheumatic mitral (valve) prolapse: Secondary | ICD-10-CM

## 2016-08-05 ENCOUNTER — Other Ambulatory Visit: Payer: Self-pay

## 2016-08-05 ENCOUNTER — Ambulatory Visit (HOSPITAL_COMMUNITY): Payer: Medicare Other | Attending: Cardiovascular Disease

## 2016-08-05 DIAGNOSIS — I341 Nonrheumatic mitral (valve) prolapse: Secondary | ICD-10-CM | POA: Diagnosis not present

## 2016-12-31 ENCOUNTER — Ambulatory Visit (INDEPENDENT_AMBULATORY_CARE_PROVIDER_SITE_OTHER): Payer: Self-pay | Admitting: Podiatry

## 2016-12-31 ENCOUNTER — Ambulatory Visit (INDEPENDENT_AMBULATORY_CARE_PROVIDER_SITE_OTHER): Payer: Medicare Other

## 2016-12-31 ENCOUNTER — Encounter: Payer: Self-pay | Admitting: Podiatry

## 2016-12-31 VITALS — BP 118/65 | HR 65 | Resp 18

## 2016-12-31 DIAGNOSIS — S93409A Sprain of unspecified ligament of unspecified ankle, initial encounter: Secondary | ICD-10-CM

## 2016-12-31 DIAGNOSIS — M79673 Pain in unspecified foot: Secondary | ICD-10-CM

## 2016-12-31 NOTE — Progress Notes (Signed)
   Subjective:    Patient ID: Kevin Aguirre, male    DOB: March 28, 1951, 66 y.o.   MRN: [id number]  HPI  66 year old male presents the office today for concerns of pain to both of his feet and ankles was been ongoing for about 12 years. He states that he did sprain his ankles a teenager and he's had ongoing pain to his feet since then. He denies any recent injury or trauma. Denies any numbness or tingling to his feet. Denies any swelling to the area. He states that his feet feel stiff in the morning after he's been very active the next day. He states that in the morning when he gets up he has to get his feet "warmed up" the morning after being active the day before. He has no other concerns today  Review of Systems  All other systems reviewed and are negative.      Objective:   Physical Exam  General: AAO x3, NAD  Dermatological: Skin is warm, dry and supple bilateral. Nails x 10 are well manicured; remaining integument appears unremarkable at this time. There are no open sores, no preulcerative lesions, no rash or signs of infection present.  Vascular: Dorsalis Pedis artery and Posterior Tibial artery pedal pulses are 2/4 bilateral with immedate capillary fill time. There is no pain with calf compression, swelling, warmth, erythema.   Neruologic: Grossly intact via light touch bilateral.Protective threshold with Semmes Wienstein monofilament intact to all pedal sites bilateral.   Musculoskeletal: There is no area pinpoint bony tenderness is no pain vibratory sensation. There is no pain to the ankle but there is some mild discomfort on the sinus tarsi and with subtalar joint range of motion which is somewhat limited as well. Mild diffuse tenderness submetatarsal 2 through 5 bilaterally. There is no palpable neuroma identified. Achilles tendon intact. No pain on the lateral ankle ligaments or medial ankle ligaments are syndesmosis. There is no overlying edema, erythema, increase in warmth.  Muscular strength 5/5 in all groups tested bilateral.  Gait: Unassisted, Nonantalgic.      Assessment & Plan:  66 year old male with bilateral chronic foot pain capsulitis/metatarsalgia -Treatment options discussed including all alternatives, risks, and complications -Etiology of symptoms were discussed -X-rays were obtained and reviewed with the patient. Nose acute fractures and 5. Arthritic changes present for the subtalar joint. -At this point given his long-term symptoms I do believe a lot of his symptoms are biomechanical in nature. I recommended a custom insert and he wishes to proceed with this. He was molded for orthotics today. He was aware the cost. We'll do a metatarsal pad with the insert to help the metatarsalgia. -Wish to roughen steroid injection again after the cement ongoing for 12 years and not sure a steroid injection to help. -RTC 3 weeks to pick up inserts or sooner if needed.  [place]

## 2017-01-27 ENCOUNTER — Ambulatory Visit: Payer: Medicare Other | Admitting: Orthotics

## 2017-01-27 DIAGNOSIS — S93409A Sprain of unspecified ligament of unspecified ankle, initial encounter: Secondary | ICD-10-CM

## 2017-01-27 NOTE — Progress Notes (Signed)
Patient came in today to pick up custom made foot orthotics.  The goals were accomplished and the patient reported no dissatisfaction with said orthotics.  Patient was advised of breakin period and how to report any issues. 

## 2017-02-17 DIAGNOSIS — M79676 Pain in unspecified toe(s): Secondary | ICD-10-CM

## 2019-07-02 ENCOUNTER — Ambulatory Visit: Payer: Medicare Other

## 2019-07-20 ENCOUNTER — Ambulatory Visit: Payer: Medicare Other

## 2020-05-28 DIAGNOSIS — K219 Gastro-esophageal reflux disease without esophagitis: Secondary | ICD-10-CM | POA: Diagnosis not present

## 2020-05-28 DIAGNOSIS — E7800 Pure hypercholesterolemia, unspecified: Secondary | ICD-10-CM | POA: Diagnosis not present

## 2020-05-28 DIAGNOSIS — M1990 Unspecified osteoarthritis, unspecified site: Secondary | ICD-10-CM | POA: Diagnosis not present

## 2020-05-28 DIAGNOSIS — I10 Essential (primary) hypertension: Secondary | ICD-10-CM | POA: Diagnosis not present

## 2020-05-28 DIAGNOSIS — G4700 Insomnia, unspecified: Secondary | ICD-10-CM | POA: Diagnosis not present

## 2020-07-23 DIAGNOSIS — G4700 Insomnia, unspecified: Secondary | ICD-10-CM | POA: Diagnosis not present

## 2020-07-23 DIAGNOSIS — K219 Gastro-esophageal reflux disease without esophagitis: Secondary | ICD-10-CM | POA: Diagnosis not present

## 2020-07-23 DIAGNOSIS — M1990 Unspecified osteoarthritis, unspecified site: Secondary | ICD-10-CM | POA: Diagnosis not present

## 2020-07-23 DIAGNOSIS — E7800 Pure hypercholesterolemia, unspecified: Secondary | ICD-10-CM | POA: Diagnosis not present

## 2020-07-23 DIAGNOSIS — I10 Essential (primary) hypertension: Secondary | ICD-10-CM | POA: Diagnosis not present

## 2020-07-24 DIAGNOSIS — M7672 Peroneal tendinitis, left leg: Secondary | ICD-10-CM | POA: Diagnosis not present

## 2020-07-30 DIAGNOSIS — R109 Unspecified abdominal pain: Secondary | ICD-10-CM | POA: Diagnosis not present

## 2020-07-30 DIAGNOSIS — G4700 Insomnia, unspecified: Secondary | ICD-10-CM | POA: Diagnosis not present

## 2020-08-26 DIAGNOSIS — E7800 Pure hypercholesterolemia, unspecified: Secondary | ICD-10-CM | POA: Diagnosis not present

## 2020-08-26 DIAGNOSIS — I10 Essential (primary) hypertension: Secondary | ICD-10-CM | POA: Diagnosis not present

## 2020-08-26 DIAGNOSIS — G4700 Insomnia, unspecified: Secondary | ICD-10-CM | POA: Diagnosis not present

## 2020-08-26 DIAGNOSIS — K219 Gastro-esophageal reflux disease without esophagitis: Secondary | ICD-10-CM | POA: Diagnosis not present

## 2020-08-26 DIAGNOSIS — M1990 Unspecified osteoarthritis, unspecified site: Secondary | ICD-10-CM | POA: Diagnosis not present

## 2020-10-17 DIAGNOSIS — G4700 Insomnia, unspecified: Secondary | ICD-10-CM | POA: Diagnosis not present

## 2020-10-17 DIAGNOSIS — I10 Essential (primary) hypertension: Secondary | ICD-10-CM | POA: Diagnosis not present

## 2020-10-17 DIAGNOSIS — K219 Gastro-esophageal reflux disease without esophagitis: Secondary | ICD-10-CM | POA: Diagnosis not present

## 2020-10-17 DIAGNOSIS — M1990 Unspecified osteoarthritis, unspecified site: Secondary | ICD-10-CM | POA: Diagnosis not present

## 2020-10-17 DIAGNOSIS — E7800 Pure hypercholesterolemia, unspecified: Secondary | ICD-10-CM | POA: Diagnosis not present

## 2020-10-22 DIAGNOSIS — K5730 Diverticulosis of large intestine without perforation or abscess without bleeding: Secondary | ICD-10-CM | POA: Diagnosis not present

## 2020-10-22 DIAGNOSIS — K6389 Other specified diseases of intestine: Secondary | ICD-10-CM | POA: Diagnosis not present

## 2020-10-22 DIAGNOSIS — Z1211 Encounter for screening for malignant neoplasm of colon: Secondary | ICD-10-CM | POA: Diagnosis not present

## 2020-10-22 DIAGNOSIS — Z86010 Personal history of colonic polyps: Secondary | ICD-10-CM | POA: Diagnosis not present

## 2020-11-04 DIAGNOSIS — G4733 Obstructive sleep apnea (adult) (pediatric): Secondary | ICD-10-CM | POA: Diagnosis not present

## 2020-12-23 DIAGNOSIS — E7800 Pure hypercholesterolemia, unspecified: Secondary | ICD-10-CM | POA: Diagnosis not present

## 2020-12-23 DIAGNOSIS — K219 Gastro-esophageal reflux disease without esophagitis: Secondary | ICD-10-CM | POA: Diagnosis not present

## 2020-12-23 DIAGNOSIS — I10 Essential (primary) hypertension: Secondary | ICD-10-CM | POA: Diagnosis not present

## 2020-12-23 DIAGNOSIS — M1990 Unspecified osteoarthritis, unspecified site: Secondary | ICD-10-CM | POA: Diagnosis not present

## 2020-12-23 DIAGNOSIS — G4700 Insomnia, unspecified: Secondary | ICD-10-CM | POA: Diagnosis not present

## 2021-01-19 DIAGNOSIS — G4733 Obstructive sleep apnea (adult) (pediatric): Secondary | ICD-10-CM | POA: Diagnosis not present

## 2021-02-04 DIAGNOSIS — E7800 Pure hypercholesterolemia, unspecified: Secondary | ICD-10-CM | POA: Diagnosis not present

## 2021-02-04 DIAGNOSIS — R7303 Prediabetes: Secondary | ICD-10-CM | POA: Diagnosis not present

## 2021-02-04 DIAGNOSIS — I10 Essential (primary) hypertension: Secondary | ICD-10-CM | POA: Diagnosis not present

## 2021-02-09 DIAGNOSIS — I10 Essential (primary) hypertension: Secondary | ICD-10-CM | POA: Diagnosis not present

## 2021-02-09 DIAGNOSIS — Z23 Encounter for immunization: Secondary | ICD-10-CM | POA: Diagnosis not present

## 2021-02-09 DIAGNOSIS — K219 Gastro-esophageal reflux disease without esophagitis: Secondary | ICD-10-CM | POA: Diagnosis not present

## 2021-02-09 DIAGNOSIS — E1169 Type 2 diabetes mellitus with other specified complication: Secondary | ICD-10-CM | POA: Diagnosis not present

## 2021-02-09 DIAGNOSIS — E7800 Pure hypercholesterolemia, unspecified: Secondary | ICD-10-CM | POA: Diagnosis not present

## 2021-02-09 DIAGNOSIS — Z0000 Encounter for general adult medical examination without abnormal findings: Secondary | ICD-10-CM | POA: Diagnosis not present

## 2021-02-09 DIAGNOSIS — G4700 Insomnia, unspecified: Secondary | ICD-10-CM | POA: Diagnosis not present

## 2021-03-11 DIAGNOSIS — K219 Gastro-esophageal reflux disease without esophagitis: Secondary | ICD-10-CM | POA: Diagnosis not present

## 2021-03-11 DIAGNOSIS — G4700 Insomnia, unspecified: Secondary | ICD-10-CM | POA: Diagnosis not present

## 2021-03-11 DIAGNOSIS — I10 Essential (primary) hypertension: Secondary | ICD-10-CM | POA: Diagnosis not present

## 2021-03-11 DIAGNOSIS — E1169 Type 2 diabetes mellitus with other specified complication: Secondary | ICD-10-CM | POA: Diagnosis not present

## 2021-03-11 DIAGNOSIS — E7800 Pure hypercholesterolemia, unspecified: Secondary | ICD-10-CM | POA: Diagnosis not present

## 2021-03-11 DIAGNOSIS — M1990 Unspecified osteoarthritis, unspecified site: Secondary | ICD-10-CM | POA: Diagnosis not present

## 2021-03-12 DIAGNOSIS — G5702 Lesion of sciatic nerve, left lower limb: Secondary | ICD-10-CM | POA: Diagnosis not present

## 2021-03-12 DIAGNOSIS — M898X5 Other specified disorders of bone, thigh: Secondary | ICD-10-CM | POA: Diagnosis not present

## 2021-03-12 DIAGNOSIS — M25552 Pain in left hip: Secondary | ICD-10-CM | POA: Diagnosis not present

## 2021-04-01 DIAGNOSIS — K219 Gastro-esophageal reflux disease without esophagitis: Secondary | ICD-10-CM | POA: Diagnosis not present

## 2021-04-01 DIAGNOSIS — M1990 Unspecified osteoarthritis, unspecified site: Secondary | ICD-10-CM | POA: Diagnosis not present

## 2021-04-01 DIAGNOSIS — E7800 Pure hypercholesterolemia, unspecified: Secondary | ICD-10-CM | POA: Diagnosis not present

## 2021-04-01 DIAGNOSIS — E1169 Type 2 diabetes mellitus with other specified complication: Secondary | ICD-10-CM | POA: Diagnosis not present

## 2021-04-01 DIAGNOSIS — G4700 Insomnia, unspecified: Secondary | ICD-10-CM | POA: Diagnosis not present

## 2021-04-01 DIAGNOSIS — I10 Essential (primary) hypertension: Secondary | ICD-10-CM | POA: Diagnosis not present

## 2021-05-04 DIAGNOSIS — G4733 Obstructive sleep apnea (adult) (pediatric): Secondary | ICD-10-CM | POA: Diagnosis not present

## 2021-05-19 DIAGNOSIS — M1990 Unspecified osteoarthritis, unspecified site: Secondary | ICD-10-CM | POA: Diagnosis not present

## 2021-05-19 DIAGNOSIS — I10 Essential (primary) hypertension: Secondary | ICD-10-CM | POA: Diagnosis not present

## 2021-05-19 DIAGNOSIS — G4700 Insomnia, unspecified: Secondary | ICD-10-CM | POA: Diagnosis not present

## 2021-05-19 DIAGNOSIS — E1169 Type 2 diabetes mellitus with other specified complication: Secondary | ICD-10-CM | POA: Diagnosis not present

## 2021-05-19 DIAGNOSIS — E7800 Pure hypercholesterolemia, unspecified: Secondary | ICD-10-CM | POA: Diagnosis not present

## 2021-05-19 DIAGNOSIS — K219 Gastro-esophageal reflux disease without esophagitis: Secondary | ICD-10-CM | POA: Diagnosis not present

## 2021-08-10 DIAGNOSIS — E1169 Type 2 diabetes mellitus with other specified complication: Secondary | ICD-10-CM | POA: Diagnosis not present

## 2021-08-12 DIAGNOSIS — F5101 Primary insomnia: Secondary | ICD-10-CM | POA: Diagnosis not present

## 2021-08-12 DIAGNOSIS — E1169 Type 2 diabetes mellitus with other specified complication: Secondary | ICD-10-CM | POA: Diagnosis not present

## 2021-11-27 DIAGNOSIS — G4733 Obstructive sleep apnea (adult) (pediatric): Secondary | ICD-10-CM | POA: Diagnosis not present

## 2021-12-16 DIAGNOSIS — H2513 Age-related nuclear cataract, bilateral: Secondary | ICD-10-CM | POA: Diagnosis not present

## 2021-12-16 DIAGNOSIS — E119 Type 2 diabetes mellitus without complications: Secondary | ICD-10-CM | POA: Diagnosis not present

## 2022-02-01 DIAGNOSIS — G4733 Obstructive sleep apnea (adult) (pediatric): Secondary | ICD-10-CM | POA: Diagnosis not present

## 2022-02-10 DIAGNOSIS — E7800 Pure hypercholesterolemia, unspecified: Secondary | ICD-10-CM | POA: Diagnosis not present

## 2022-02-10 DIAGNOSIS — Z23 Encounter for immunization: Secondary | ICD-10-CM | POA: Diagnosis not present

## 2022-02-10 DIAGNOSIS — E1169 Type 2 diabetes mellitus with other specified complication: Secondary | ICD-10-CM | POA: Diagnosis not present

## 2022-02-10 DIAGNOSIS — I10 Essential (primary) hypertension: Secondary | ICD-10-CM | POA: Diagnosis not present

## 2022-02-10 DIAGNOSIS — Z0000 Encounter for general adult medical examination without abnormal findings: Secondary | ICD-10-CM | POA: Diagnosis not present

## 2022-02-10 DIAGNOSIS — K219 Gastro-esophageal reflux disease without esophagitis: Secondary | ICD-10-CM | POA: Diagnosis not present

## 2022-02-10 DIAGNOSIS — F5101 Primary insomnia: Secondary | ICD-10-CM | POA: Diagnosis not present

## 2022-02-12 DIAGNOSIS — E1169 Type 2 diabetes mellitus with other specified complication: Secondary | ICD-10-CM | POA: Diagnosis not present

## 2022-03-03 DIAGNOSIS — G4733 Obstructive sleep apnea (adult) (pediatric): Secondary | ICD-10-CM | POA: Diagnosis not present

## 2022-03-22 DIAGNOSIS — L989 Disorder of the skin and subcutaneous tissue, unspecified: Secondary | ICD-10-CM | POA: Diagnosis not present

## 2022-03-24 DIAGNOSIS — D234 Other benign neoplasm of skin of scalp and neck: Secondary | ICD-10-CM | POA: Diagnosis not present

## 2022-03-24 DIAGNOSIS — L820 Inflamed seborrheic keratosis: Secondary | ICD-10-CM | POA: Diagnosis not present

## 2022-03-24 DIAGNOSIS — D235 Other benign neoplasm of skin of trunk: Secondary | ICD-10-CM | POA: Diagnosis not present

## 2022-03-29 DIAGNOSIS — M62830 Muscle spasm of back: Secondary | ICD-10-CM | POA: Diagnosis not present

## 2022-04-07 DIAGNOSIS — K219 Gastro-esophageal reflux disease without esophagitis: Secondary | ICD-10-CM | POA: Diagnosis not present

## 2022-04-07 DIAGNOSIS — R739 Hyperglycemia, unspecified: Secondary | ICD-10-CM | POA: Diagnosis not present

## 2022-04-07 DIAGNOSIS — E785 Hyperlipidemia, unspecified: Secondary | ICD-10-CM | POA: Diagnosis not present

## 2022-04-07 DIAGNOSIS — Z79899 Other long term (current) drug therapy: Secondary | ICD-10-CM | POA: Diagnosis not present

## 2022-04-07 DIAGNOSIS — G4700 Insomnia, unspecified: Secondary | ICD-10-CM | POA: Diagnosis not present

## 2022-04-07 DIAGNOSIS — Z87891 Personal history of nicotine dependence: Secondary | ICD-10-CM | POA: Diagnosis not present

## 2022-04-07 DIAGNOSIS — M546 Pain in thoracic spine: Secondary | ICD-10-CM | POA: Diagnosis not present

## 2022-04-07 DIAGNOSIS — I10 Essential (primary) hypertension: Secondary | ICD-10-CM | POA: Diagnosis not present

## 2022-04-27 DIAGNOSIS — J019 Acute sinusitis, unspecified: Secondary | ICD-10-CM | POA: Diagnosis not present

## 2022-04-27 DIAGNOSIS — J22 Unspecified acute lower respiratory infection: Secondary | ICD-10-CM | POA: Diagnosis not present

## 2022-04-27 DIAGNOSIS — R051 Acute cough: Secondary | ICD-10-CM | POA: Diagnosis not present

## 2022-04-27 DIAGNOSIS — Z20822 Contact with and (suspected) exposure to covid-19: Secondary | ICD-10-CM | POA: Diagnosis not present

## 2022-05-05 DIAGNOSIS — Z87891 Personal history of nicotine dependence: Secondary | ICD-10-CM | POA: Diagnosis not present

## 2022-05-05 DIAGNOSIS — J9801 Acute bronchospasm: Secondary | ICD-10-CM | POA: Diagnosis not present

## 2022-05-05 DIAGNOSIS — J22 Unspecified acute lower respiratory infection: Secondary | ICD-10-CM | POA: Diagnosis not present

## 2022-06-10 DIAGNOSIS — J9801 Acute bronchospasm: Secondary | ICD-10-CM | POA: Diagnosis not present

## 2022-06-10 DIAGNOSIS — J22 Unspecified acute lower respiratory infection: Secondary | ICD-10-CM | POA: Diagnosis not present

## 2022-10-06 DIAGNOSIS — Z79899 Other long term (current) drug therapy: Secondary | ICD-10-CM | POA: Diagnosis not present

## 2022-10-06 DIAGNOSIS — I10 Essential (primary) hypertension: Secondary | ICD-10-CM | POA: Diagnosis not present

## 2022-10-06 DIAGNOSIS — G4700 Insomnia, unspecified: Secondary | ICD-10-CM | POA: Diagnosis not present

## 2022-10-06 DIAGNOSIS — R739 Hyperglycemia, unspecified: Secondary | ICD-10-CM | POA: Diagnosis not present

## 2022-10-06 DIAGNOSIS — E785 Hyperlipidemia, unspecified: Secondary | ICD-10-CM | POA: Diagnosis not present

## 2022-10-06 DIAGNOSIS — K219 Gastro-esophageal reflux disease without esophagitis: Secondary | ICD-10-CM | POA: Diagnosis not present
